# Patient Record
Sex: Female | Born: 2000 | Race: White | Hispanic: No | Marital: Single | Smoking: Never smoker
Health system: Southern US, Community
[De-identification: ages and names within clinical notes are randomized; demographics above are authoritative.]

## PROBLEM LIST (undated history)

## (undated) HISTORY — PX: WISDOM TOOTH EXTRACTION: SHX21

---

## 2019-08-24 DIAGNOSIS — N200 Calculus of kidney: Secondary | ICD-10-CM | POA: Insufficient documentation

## 2019-12-28 DIAGNOSIS — F988 Other specified behavioral and emotional disorders with onset usually occurring in childhood and adolescence: Secondary | ICD-10-CM | POA: Insufficient documentation

## 2020-03-23 DIAGNOSIS — K5909 Other constipation: Secondary | ICD-10-CM | POA: Insufficient documentation

## 2020-03-23 DIAGNOSIS — R5381 Other malaise: Secondary | ICD-10-CM | POA: Insufficient documentation

## 2020-10-04 DIAGNOSIS — E611 Iron deficiency: Secondary | ICD-10-CM | POA: Insufficient documentation

## 2020-11-17 DIAGNOSIS — E538 Deficiency of other specified B group vitamins: Secondary | ICD-10-CM | POA: Diagnosis not present

## 2020-11-28 DIAGNOSIS — Z87440 Personal history of urinary (tract) infections: Secondary | ICD-10-CM | POA: Diagnosis not present

## 2020-11-30 DIAGNOSIS — E538 Deficiency of other specified B group vitamins: Secondary | ICD-10-CM | POA: Diagnosis not present

## 2020-12-14 DIAGNOSIS — M546 Pain in thoracic spine: Secondary | ICD-10-CM | POA: Diagnosis not present

## 2020-12-14 DIAGNOSIS — E538 Deficiency of other specified B group vitamins: Secondary | ICD-10-CM | POA: Diagnosis not present

## 2020-12-14 DIAGNOSIS — G8929 Other chronic pain: Secondary | ICD-10-CM | POA: Diagnosis not present

## 2020-12-14 DIAGNOSIS — M545 Low back pain, unspecified: Secondary | ICD-10-CM | POA: Diagnosis not present

## 2020-12-19 DIAGNOSIS — R8271 Bacteriuria: Secondary | ICD-10-CM | POA: Diagnosis not present

## 2020-12-19 DIAGNOSIS — N390 Urinary tract infection, site not specified: Secondary | ICD-10-CM | POA: Diagnosis not present

## 2020-12-28 DIAGNOSIS — E538 Deficiency of other specified B group vitamins: Secondary | ICD-10-CM | POA: Diagnosis not present

## 2021-01-12 DIAGNOSIS — E538 Deficiency of other specified B group vitamins: Secondary | ICD-10-CM | POA: Diagnosis not present

## 2021-01-13 DIAGNOSIS — E538 Deficiency of other specified B group vitamins: Secondary | ICD-10-CM | POA: Diagnosis not present

## 2021-01-18 DIAGNOSIS — M6289 Other specified disorders of muscle: Secondary | ICD-10-CM | POA: Diagnosis not present

## 2021-01-18 DIAGNOSIS — R350 Frequency of micturition: Secondary | ICD-10-CM | POA: Diagnosis not present

## 2021-01-18 DIAGNOSIS — R102 Pelvic and perineal pain: Secondary | ICD-10-CM | POA: Diagnosis not present

## 2021-01-18 DIAGNOSIS — M62838 Other muscle spasm: Secondary | ICD-10-CM | POA: Diagnosis not present

## 2021-01-25 DIAGNOSIS — E538 Deficiency of other specified B group vitamins: Secondary | ICD-10-CM | POA: Diagnosis not present

## 2021-01-26 DIAGNOSIS — K5900 Constipation, unspecified: Secondary | ICD-10-CM | POA: Diagnosis not present

## 2021-01-26 DIAGNOSIS — M62838 Other muscle spasm: Secondary | ICD-10-CM | POA: Diagnosis not present

## 2021-01-26 DIAGNOSIS — M6289 Other specified disorders of muscle: Secondary | ICD-10-CM | POA: Diagnosis not present

## 2021-01-26 DIAGNOSIS — M6281 Muscle weakness (generalized): Secondary | ICD-10-CM | POA: Diagnosis not present

## 2021-02-08 DIAGNOSIS — E538 Deficiency of other specified B group vitamins: Secondary | ICD-10-CM | POA: Diagnosis not present

## 2021-02-09 DIAGNOSIS — M62838 Other muscle spasm: Secondary | ICD-10-CM | POA: Diagnosis not present

## 2021-02-09 DIAGNOSIS — K5900 Constipation, unspecified: Secondary | ICD-10-CM | POA: Diagnosis not present

## 2021-02-09 DIAGNOSIS — M6281 Muscle weakness (generalized): Secondary | ICD-10-CM | POA: Diagnosis not present

## 2021-02-09 DIAGNOSIS — M6289 Other specified disorders of muscle: Secondary | ICD-10-CM | POA: Diagnosis not present

## 2021-02-16 DIAGNOSIS — M62838 Other muscle spasm: Secondary | ICD-10-CM | POA: Diagnosis not present

## 2021-02-16 DIAGNOSIS — K5900 Constipation, unspecified: Secondary | ICD-10-CM | POA: Diagnosis not present

## 2021-02-16 DIAGNOSIS — M6281 Muscle weakness (generalized): Secondary | ICD-10-CM | POA: Diagnosis not present

## 2021-02-16 DIAGNOSIS — M6289 Other specified disorders of muscle: Secondary | ICD-10-CM | POA: Diagnosis not present

## 2021-02-22 DIAGNOSIS — E611 Iron deficiency: Secondary | ICD-10-CM | POA: Diagnosis not present

## 2021-02-22 DIAGNOSIS — R5383 Other fatigue: Secondary | ICD-10-CM | POA: Diagnosis not present

## 2021-02-22 DIAGNOSIS — R5381 Other malaise: Secondary | ICD-10-CM | POA: Diagnosis not present

## 2021-03-02 DIAGNOSIS — M6281 Muscle weakness (generalized): Secondary | ICD-10-CM | POA: Diagnosis not present

## 2021-03-02 DIAGNOSIS — M62838 Other muscle spasm: Secondary | ICD-10-CM | POA: Diagnosis not present

## 2021-03-02 DIAGNOSIS — K5900 Constipation, unspecified: Secondary | ICD-10-CM | POA: Diagnosis not present

## 2021-03-02 DIAGNOSIS — M6289 Other specified disorders of muscle: Secondary | ICD-10-CM | POA: Diagnosis not present

## 2021-03-05 DIAGNOSIS — R531 Weakness: Secondary | ICD-10-CM | POA: Diagnosis not present

## 2021-03-13 DIAGNOSIS — K5900 Constipation, unspecified: Secondary | ICD-10-CM | POA: Diagnosis not present

## 2021-03-13 DIAGNOSIS — R102 Pelvic and perineal pain: Secondary | ICD-10-CM | POA: Diagnosis not present

## 2021-03-13 DIAGNOSIS — M62838 Other muscle spasm: Secondary | ICD-10-CM | POA: Diagnosis not present

## 2021-03-13 DIAGNOSIS — M6289 Other specified disorders of muscle: Secondary | ICD-10-CM | POA: Diagnosis not present

## 2021-03-15 DIAGNOSIS — E538 Deficiency of other specified B group vitamins: Secondary | ICD-10-CM | POA: Diagnosis not present

## 2021-03-22 DIAGNOSIS — R5381 Other malaise: Secondary | ICD-10-CM | POA: Diagnosis not present

## 2021-03-22 DIAGNOSIS — R5383 Other fatigue: Secondary | ICD-10-CM | POA: Diagnosis not present

## 2021-03-22 DIAGNOSIS — M791 Myalgia, unspecified site: Secondary | ICD-10-CM | POA: Diagnosis not present

## 2021-03-22 DIAGNOSIS — E611 Iron deficiency: Secondary | ICD-10-CM | POA: Diagnosis not present

## 2021-03-22 DIAGNOSIS — F988 Other specified behavioral and emotional disorders with onset usually occurring in childhood and adolescence: Secondary | ICD-10-CM | POA: Diagnosis not present

## 2021-03-23 DIAGNOSIS — R102 Pelvic and perineal pain: Secondary | ICD-10-CM | POA: Diagnosis not present

## 2021-03-23 DIAGNOSIS — M6289 Other specified disorders of muscle: Secondary | ICD-10-CM | POA: Diagnosis not present

## 2021-03-23 DIAGNOSIS — M6281 Muscle weakness (generalized): Secondary | ICD-10-CM | POA: Diagnosis not present

## 2021-03-23 DIAGNOSIS — M62838 Other muscle spasm: Secondary | ICD-10-CM | POA: Diagnosis not present

## 2021-03-29 DIAGNOSIS — E538 Deficiency of other specified B group vitamins: Secondary | ICD-10-CM | POA: Diagnosis not present

## 2021-04-05 DIAGNOSIS — K5900 Constipation, unspecified: Secondary | ICD-10-CM | POA: Diagnosis not present

## 2021-04-05 DIAGNOSIS — M62838 Other muscle spasm: Secondary | ICD-10-CM | POA: Diagnosis not present

## 2021-04-05 DIAGNOSIS — M6281 Muscle weakness (generalized): Secondary | ICD-10-CM | POA: Diagnosis not present

## 2021-04-05 DIAGNOSIS — M6289 Other specified disorders of muscle: Secondary | ICD-10-CM | POA: Diagnosis not present

## 2021-04-12 DIAGNOSIS — E611 Iron deficiency: Secondary | ICD-10-CM | POA: Diagnosis not present

## 2021-07-25 DIAGNOSIS — R519 Headache, unspecified: Secondary | ICD-10-CM | POA: Insufficient documentation

## 2021-07-25 DIAGNOSIS — G8929 Other chronic pain: Secondary | ICD-10-CM | POA: Insufficient documentation

## 2021-07-26 DIAGNOSIS — R5383 Other fatigue: Secondary | ICD-10-CM | POA: Diagnosis not present

## 2021-07-26 DIAGNOSIS — R5381 Other malaise: Secondary | ICD-10-CM | POA: Diagnosis not present

## 2021-07-26 DIAGNOSIS — E611 Iron deficiency: Secondary | ICD-10-CM | POA: Diagnosis not present

## 2021-07-31 DIAGNOSIS — E538 Deficiency of other specified B group vitamins: Secondary | ICD-10-CM | POA: Insufficient documentation

## 2021-08-04 ENCOUNTER — Telehealth: Payer: Self-pay | Admitting: Oncology

## 2021-08-04 NOTE — Telephone Encounter (Signed)
Scheduled appt per 10/24 referral. Pt is aware of appt date and time.  

## 2021-08-08 ENCOUNTER — Telehealth: Payer: Self-pay | Admitting: Oncology

## 2021-08-08 NOTE — Telephone Encounter (Signed)
Patient rescheduled from 11/16 to 11/3 Labs 3:00 pm - Consult 3:30 pm.  Patient is experiencing dizziness

## 2021-08-09 ENCOUNTER — Other Ambulatory Visit: Payer: Self-pay | Admitting: Oncology

## 2021-08-09 DIAGNOSIS — D539 Nutritional anemia, unspecified: Secondary | ICD-10-CM

## 2021-08-09 NOTE — Progress Notes (Signed)
[facility]  [address] [place],  [place]  [address] [phone]  Clinic Day:  08/10/2021  Referring physician: Bess Harvest*   HISTORY OF PRESENT ILLNESS:  The patient is a 20 y.o. female  who I was asked to consult upon for having a history of iron deficiency anemia and B12 deficiency.  The patient has been taking an iron pill daily for the past year.  She claims her menstrual cycles are not particularly heavy.  She also denies other overt forms of blood loss.  With respect to her B12 deficiency, she takes two B12 injections every month.  To her knowledge, there is no family history of anemia or other hematologic disorders.  The patient has occasional heart palpitations; she and her family are not certain if they are related to her hematologic issues.  PAST MEDICAL HISTORY:  Iron deficiency anemia Vitamin B12 deficiency  PAST SURGICAL HISTORY:  Urethral surgery  CURRENT MEDICATIONS:   Current Outpatient Medications  Medication Sig Dispense Refill   cyanocobalamin (,VITAMIN B-12,) 1000 MCG/ML injection Inject into the muscle.     lisdexamfetamine (VYVANSE) 20 MG capsule Take 1 capsule by mouth every morning.     norethindrone-ethinyl estradiol-FE (LOESTRIN FE) 1-20 MG-MCG tablet Take 1 tablet by mouth daily.     Syringe/Needle, Disp, (SYRINGE 3CC/25GX1") 25G X 1" 3 ML MISC Use once a month with b12     ferrous sulfate 325 (65 FE) MG tablet Take by mouth.     Multiple Vitamin (MULTI-VITAMINS) TABS Take 1 tablet by mouth daily.     No current facility-administered medications for this visit.    ALLERGIES:   Allergies  Allergen Reactions   Penicillins Hives    FAMILY HISTORY:   Family History  Problem Relation Age of Onset   Thyroid disease Mother    Hyperlipidemia Mother    Hyperlipidemia Father    Hypertension Father     SOCIAL HISTORY:  The patient was born and raised in [place].  She lives in [place] with  her parents.  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  She denies tobacco use.  She does drink alcohol occasionally.  REVIEW OF SYSTEMS:  Review of Systems  Constitutional:  Positive for fatigue. Negative for fever.  HENT:   Negative for hearing loss and sore throat.   Eyes:  Negative for eye problems.  Respiratory:  Positive for shortness of breath (with exertion). Negative for chest tightness, cough and hemoptysis.   Cardiovascular:  Positive for palpitations. Negative for chest pain.  Gastrointestinal:  Positive for constipation. Negative for abdominal distention, abdominal pain, blood in stool, diarrhea, nausea and vomiting.  Endocrine: Negative for hot flashes.  Genitourinary:  Negative for difficulty urinating, dysuria, frequency, hematuria and nocturia.   Musculoskeletal:  Positive for myalgias. Negative for arthralgias, back pain and gait problem.  Skin: Negative.  Negative for itching and rash.  Neurological:  Positive for headaches. Negative for dizziness, extremity weakness, gait problem, light-headedness and numbness.  Hematological: Negative.   Psychiatric/Behavioral:  Negative for depression and suicidal ideas. The patient is nervous/anxious.     PHYSICAL EXAM:  Blood pressure 135/85, pulse (!) 113, temperature 98.5 F (36.9 C), resp. rate 16, height 5\' 7"  (1.702 m), weight 132 lb 12.8 oz (60.2 kg), SpO2 100 %. Wt Readings from Last 3 Encounters:  08/10/21 132 lb 12.8 oz (60.2 kg)   Body mass index is 20.8 kg/m. Performance status (ECOG): 0 - Asymptomatic Physical Exam Constitutional:  Appearance: Normal appearance. She is not ill-appearing.  HENT:     Mouth/Throat:     Mouth: Mucous membranes are moist.     Pharynx: Oropharynx is clear. No oropharyngeal exudate or posterior oropharyngeal erythema.  Cardiovascular:     Rate and Rhythm: Normal rate and regular rhythm.     Heart sounds: No murmur heard.   No friction rub. No gallop.  Pulmonary:     Effort: Pulmonary  effort is normal. No respiratory distress.     Breath sounds: Normal breath sounds. No wheezing, rhonchi or rales.  Abdominal:     General: Bowel sounds are normal. There is no distension.     Palpations: Abdomen is soft. There is no mass.     Tenderness: There is no abdominal tenderness.  Musculoskeletal:        General: No swelling.     Right lower leg: No edema.     Left lower leg: No edema.  Lymphadenopathy:     Cervical: No cervical adenopathy.     Upper Body:     Right upper body: No supraclavicular or axillary adenopathy.     Left upper body: No supraclavicular or axillary adenopathy.     Lower Body: No right inguinal adenopathy. No left inguinal adenopathy.  Skin:    General: Skin is warm.     Coloration: Skin is not jaundiced.     Findings: No lesion or rash.  Neurological:     General: No focal deficit present.     Mental Status: She is alert and oriented to person, place, and time. Mental status is at baseline.  Psychiatric:        Mood and Affect: Mood normal.        Behavior: Behavior normal.        Thought Content: Thought content normal.   LABS:   Recent labs at her primary care office included the following:    ASSESSMENT & PLAN:  A 20 y.o. female who I was asked to consult upon for anemia due to iron and B12 deficiencies.  When evaluating her CBC today, her hemoglobin is normal, as are all of her other hematologic parameters.  Recent labs collected at her primary care office showed no evidence of any persistent nutritional deficiencies.  From my standpoint, she can reduce her B12 shots to once monthly.  As her iron parameters are normal, I do not see the need for her to take iron every day.  I would not be opposed to her taking iron during those days when her menstrual cycle is active.  Overall, I do not sense any ominous hematologic problems being present.   I do feel comfortable turning her care back over to  her primary care office.  I would not have a problem  seeing this patient in the future if new hematologic issues arise that require repeat clinical assessment.    The patient understands all the plans discussed today and is in agreement with them.  I do appreciate Brown-Patram, Melissa J* for his new consult.   Rosilyn Coachman Kirby Funk, MD

## 2021-08-10 ENCOUNTER — Inpatient Hospital Stay: Payer: BC Managed Care – PPO | Attending: Oncology

## 2021-08-10 ENCOUNTER — Encounter: Payer: Self-pay | Admitting: Oncology

## 2021-08-10 ENCOUNTER — Telehealth: Payer: Self-pay | Admitting: Oncology

## 2021-08-10 ENCOUNTER — Other Ambulatory Visit: Payer: Self-pay

## 2021-08-10 ENCOUNTER — Inpatient Hospital Stay (INDEPENDENT_AMBULATORY_CARE_PROVIDER_SITE_OTHER): Payer: BC Managed Care – PPO | Admitting: Oncology

## 2021-08-10 VITALS — BP 135/85 | HR 113 | Temp 98.5°F | Resp 16 | Ht 67.0 in | Wt 132.8 lb

## 2021-08-10 DIAGNOSIS — E611 Iron deficiency: Secondary | ICD-10-CM

## 2021-08-10 DIAGNOSIS — E538 Deficiency of other specified B group vitamins: Secondary | ICD-10-CM | POA: Insufficient documentation

## 2021-08-10 DIAGNOSIS — D539 Nutritional anemia, unspecified: Secondary | ICD-10-CM

## 2021-08-10 DIAGNOSIS — D509 Iron deficiency anemia, unspecified: Secondary | ICD-10-CM | POA: Diagnosis not present

## 2021-08-10 LAB — CBC AND DIFFERENTIAL
HCT: 42 (ref 36–46)
Hemoglobin: 14.9 (ref 12.0–16.0)
Neutrophils Absolute: 4.31
Platelets: 197 (ref 150–399)
WBC: 7.3

## 2021-08-10 LAB — CBC: RBC: 4.97 (ref 3.87–5.11)

## 2021-08-10 LAB — FERRITIN: Ferritin: 54 ng/mL (ref 11–307)

## 2021-08-10 LAB — IRON AND TIBC
Iron: 99 ug/dL (ref 28–170)
Saturation Ratios: 22 % (ref 10.4–31.8)
TIBC: 459 ug/dL — ABNORMAL HIGH (ref 250–450)
UIBC: 360 ug/dL

## 2021-08-10 LAB — VITAMIN B12: Vitamin B-12: 420 pg/mL (ref 180–914)

## 2021-08-10 LAB — FOLATE: Folate: 28.3 ng/mL (ref >5.9)

## 2021-08-10 NOTE — Telephone Encounter (Signed)
No LOS Entered 

## 2021-08-23 ENCOUNTER — Other Ambulatory Visit: Payer: Self-pay

## 2021-08-23 ENCOUNTER — Encounter: Payer: Self-pay | Admitting: Oncology

## 2021-08-28 DIAGNOSIS — R079 Chest pain, unspecified: Secondary | ICD-10-CM | POA: Diagnosis not present

## 2021-08-28 DIAGNOSIS — R000 Tachycardia, unspecified: Secondary | ICD-10-CM | POA: Diagnosis not present

## 2021-08-28 DIAGNOSIS — R0789 Other chest pain: Secondary | ICD-10-CM | POA: Diagnosis not present

## 2021-08-28 DIAGNOSIS — R002 Palpitations: Secondary | ICD-10-CM | POA: Diagnosis not present

## 2021-09-05 DIAGNOSIS — R5381 Other malaise: Secondary | ICD-10-CM | POA: Diagnosis not present

## 2021-09-05 DIAGNOSIS — E611 Iron deficiency: Secondary | ICD-10-CM | POA: Diagnosis not present

## 2021-09-05 DIAGNOSIS — I4711 Inappropriate sinus tachycardia, so stated: Secondary | ICD-10-CM | POA: Insufficient documentation

## 2021-09-05 DIAGNOSIS — R000 Tachycardia, unspecified: Secondary | ICD-10-CM | POA: Diagnosis not present

## 2021-09-05 DIAGNOSIS — R002 Palpitations: Secondary | ICD-10-CM | POA: Diagnosis not present

## 2021-09-06 DIAGNOSIS — I498 Other specified cardiac arrhythmias: Secondary | ICD-10-CM | POA: Diagnosis not present

## 2021-09-20 DIAGNOSIS — R002 Palpitations: Secondary | ICD-10-CM | POA: Diagnosis not present

## 2021-10-05 DIAGNOSIS — M79641 Pain in right hand: Secondary | ICD-10-CM | POA: Diagnosis not present

## 2021-10-05 DIAGNOSIS — M79642 Pain in left hand: Secondary | ICD-10-CM | POA: Diagnosis not present

## 2021-10-11 DIAGNOSIS — R002 Palpitations: Secondary | ICD-10-CM | POA: Diagnosis not present

## 2021-10-13 DIAGNOSIS — I493 Ventricular premature depolarization: Secondary | ICD-10-CM | POA: Diagnosis not present

## 2021-10-21 DIAGNOSIS — N3091 Cystitis, unspecified with hematuria: Secondary | ICD-10-CM | POA: Diagnosis not present

## 2021-10-21 DIAGNOSIS — N3001 Acute cystitis with hematuria: Secondary | ICD-10-CM | POA: Diagnosis not present

## 2021-10-24 DIAGNOSIS — U099 Post covid-19 condition, unspecified: Secondary | ICD-10-CM | POA: Diagnosis not present

## 2021-10-24 DIAGNOSIS — E538 Deficiency of other specified B group vitamins: Secondary | ICD-10-CM | POA: Diagnosis not present

## 2021-10-24 DIAGNOSIS — R5381 Other malaise: Secondary | ICD-10-CM | POA: Diagnosis not present

## 2021-10-24 DIAGNOSIS — E611 Iron deficiency: Secondary | ICD-10-CM | POA: Diagnosis not present

## 2021-10-24 DIAGNOSIS — M791 Myalgia, unspecified site: Secondary | ICD-10-CM | POA: Diagnosis not present

## 2021-10-25 ENCOUNTER — Ambulatory Visit
Admission: RE | Admit: 2021-10-25 | Discharge: 2021-10-25 | Disposition: A | Discharge status: Home / Self Care | Payer: BC Managed Care – PPO | Source: Ambulatory Visit | Attending: Specialist | Admitting: Specialist

## 2021-10-25 ENCOUNTER — Other Ambulatory Visit: Payer: Self-pay

## 2021-10-25 ENCOUNTER — Other Ambulatory Visit: Payer: Self-pay | Admitting: Specialist

## 2021-10-25 DIAGNOSIS — J982 Interstitial emphysema: Secondary | ICD-10-CM | POA: Diagnosis not present

## 2021-10-25 DIAGNOSIS — R0902 Hypoxemia: Secondary | ICD-10-CM | POA: Diagnosis not present

## 2021-10-25 DIAGNOSIS — R7981 Abnormal blood-gas level: Secondary | ICD-10-CM

## 2021-10-25 DIAGNOSIS — R918 Other nonspecific abnormal finding of lung field: Secondary | ICD-10-CM | POA: Diagnosis not present

## 2021-10-25 MED ORDER — IOPAMIDOL (ISOVUE-370) INJECTION 76%
75.0000 mL | Freq: Once | INTRAVENOUS | Status: AC | PRN
  Administered 2021-10-25: 75 mL via INTRAVENOUS

## 2021-10-26 DIAGNOSIS — R0602 Shortness of breath: Secondary | ICD-10-CM | POA: Diagnosis not present

## 2021-10-31 DIAGNOSIS — U099 Post covid-19 condition, unspecified: Secondary | ICD-10-CM | POA: Diagnosis not present

## 2021-10-31 DIAGNOSIS — R7981 Abnormal blood-gas level: Secondary | ICD-10-CM | POA: Diagnosis not present

## 2021-10-31 DIAGNOSIS — R0602 Shortness of breath: Secondary | ICD-10-CM | POA: Diagnosis not present

## 2021-10-31 DIAGNOSIS — R059 Cough, unspecified: Secondary | ICD-10-CM | POA: Diagnosis not present

## 2021-11-07 DIAGNOSIS — J982 Interstitial emphysema: Secondary | ICD-10-CM | POA: Diagnosis not present

## 2021-11-07 DIAGNOSIS — R000 Tachycardia, unspecified: Secondary | ICD-10-CM | POA: Diagnosis not present

## 2021-11-07 DIAGNOSIS — E611 Iron deficiency: Secondary | ICD-10-CM | POA: Diagnosis not present

## 2021-11-07 DIAGNOSIS — R0602 Shortness of breath: Secondary | ICD-10-CM | POA: Diagnosis not present

## 2021-11-10 DIAGNOSIS — J0100 Acute maxillary sinusitis, unspecified: Secondary | ICD-10-CM | POA: Diagnosis not present

## 2021-11-10 DIAGNOSIS — R519 Headache, unspecified: Secondary | ICD-10-CM | POA: Diagnosis not present

## 2021-11-10 DIAGNOSIS — Z20828 Contact with and (suspected) exposure to other viral communicable diseases: Secondary | ICD-10-CM | POA: Diagnosis not present

## 2021-11-10 DIAGNOSIS — R0981 Nasal congestion: Secondary | ICD-10-CM | POA: Diagnosis not present

## 2021-11-15 DIAGNOSIS — Z87891 Personal history of nicotine dependence: Secondary | ICD-10-CM | POA: Diagnosis not present

## 2021-11-15 DIAGNOSIS — R0602 Shortness of breath: Secondary | ICD-10-CM | POA: Diagnosis not present

## 2021-11-15 LAB — PULMONARY FUNCTION TEST

## 2021-11-23 DIAGNOSIS — R7981 Abnormal blood-gas level: Secondary | ICD-10-CM | POA: Diagnosis not present

## 2021-11-23 DIAGNOSIS — R0602 Shortness of breath: Secondary | ICD-10-CM | POA: Diagnosis not present

## 2021-11-23 DIAGNOSIS — J982 Interstitial emphysema: Secondary | ICD-10-CM | POA: Diagnosis not present

## 2021-11-23 DIAGNOSIS — Z8616 Personal history of COVID-19: Secondary | ICD-10-CM | POA: Diagnosis not present

## 2021-12-25 DIAGNOSIS — Z20828 Contact with and (suspected) exposure to other viral communicable diseases: Secondary | ICD-10-CM | POA: Diagnosis not present

## 2021-12-25 DIAGNOSIS — R519 Headache, unspecified: Secondary | ICD-10-CM | POA: Diagnosis not present

## 2021-12-25 DIAGNOSIS — J111 Influenza due to unidentified influenza virus with other respiratory manifestations: Secondary | ICD-10-CM | POA: Diagnosis not present

## 2021-12-25 DIAGNOSIS — R0981 Nasal congestion: Secondary | ICD-10-CM | POA: Diagnosis not present

## 2021-12-25 DIAGNOSIS — R051 Acute cough: Secondary | ICD-10-CM | POA: Diagnosis not present

## 2022-01-03 DIAGNOSIS — K5909 Other constipation: Secondary | ICD-10-CM | POA: Diagnosis not present

## 2022-01-03 DIAGNOSIS — J982 Interstitial emphysema: Secondary | ICD-10-CM | POA: Diagnosis not present

## 2022-01-03 DIAGNOSIS — R000 Tachycardia, unspecified: Secondary | ICD-10-CM | POA: Diagnosis not present

## 2022-01-03 DIAGNOSIS — E538 Deficiency of other specified B group vitamins: Secondary | ICD-10-CM | POA: Diagnosis not present

## 2022-01-03 DIAGNOSIS — N200 Calculus of kidney: Secondary | ICD-10-CM | POA: Diagnosis not present

## 2022-02-13 DIAGNOSIS — J982 Interstitial emphysema: Secondary | ICD-10-CM | POA: Diagnosis not present

## 2022-02-20 DIAGNOSIS — Z8616 Personal history of COVID-19: Secondary | ICD-10-CM | POA: Diagnosis not present

## 2022-02-20 DIAGNOSIS — R0602 Shortness of breath: Secondary | ICD-10-CM | POA: Diagnosis not present

## 2022-02-20 DIAGNOSIS — J982 Interstitial emphysema: Secondary | ICD-10-CM | POA: Diagnosis not present

## 2022-03-22 DIAGNOSIS — U099 Post covid-19 condition, unspecified: Secondary | ICD-10-CM | POA: Diagnosis not present

## 2022-03-22 DIAGNOSIS — R5381 Other malaise: Secondary | ICD-10-CM | POA: Diagnosis not present

## 2022-03-22 DIAGNOSIS — R5383 Other fatigue: Secondary | ICD-10-CM | POA: Diagnosis not present

## 2022-03-22 DIAGNOSIS — R7981 Abnormal blood-gas level: Secondary | ICD-10-CM | POA: Diagnosis not present

## 2022-03-22 DIAGNOSIS — R000 Tachycardia, unspecified: Secondary | ICD-10-CM | POA: Diagnosis not present

## 2022-04-02 ENCOUNTER — Encounter: Payer: Self-pay | Admitting: Pulmonary Disease

## 2022-04-02 ENCOUNTER — Ambulatory Visit: Payer: BC Managed Care – PPO | Admitting: Pulmonary Disease

## 2022-04-02 VITALS — BP 116/68 | HR 86 | Temp 97.9°F | Ht 67.0 in | Wt 124.0 lb

## 2022-04-02 DIAGNOSIS — R0609 Other forms of dyspnea: Secondary | ICD-10-CM | POA: Diagnosis not present

## 2022-04-02 MED ORDER — TRELEGY ELLIPTA 200-62.5-25 MCG/ACT IN AEPB: 1.0000 | INHALATION_SPRAY | Freq: Every day | RESPIRATORY_TRACT | 0 refills | Status: DC

## 2022-04-04 ENCOUNTER — Institutional Professional Consult (permissible substitution): Payer: BC Managed Care – PPO | Admitting: Pulmonary Disease

## 2022-04-04 NOTE — Progress Notes (Signed)
_0  ID: Lisa Lawrence, female    DOB: 2001-04-06, 21 y.o.   MRN: [id number]  Chief Complaint  Patient presents with   Consult    Pt here for consult for SOB. Pt states SOB started when she had covid. Pt states she was positive for covid in 2020. Pt states she has SOB at the time. Pt is on Albuterol prn and Flovent daily     Referring provider: Bess Harvest*  HPI:   21 y.o. woman whom are seen in consultation evaluation of dyspnea on exertion.  Most recent pulmonary note x2 from [facility] reviewed.  Most recent cardiology note from [facility] reviewed.  Patient contracted COVID about 2 years ago.  Prior to that no issues.  Avid an active exerciser.  Very athletic playing sports in high school.  Since then has had marked dyspnea.  Associate with chest tightness.  Present on flat surfaces with short distances.  Worse on inclines or stairs.  No time of day that seems to make things better or worse.  No position makes it better or worse.  She does note triggers such as candle smoke, fire smoke, other scents that sometimes trigger and make the symptoms come on.  She has been using Flovent 1 puff twice a day with mild improvement in symptoms.  Uses albuterol intermittently sometimes helps.  Sometimes does not.  Does not help immediately.  She also reports tachycardia.  Heart rates up to 160s just walking around.  Out her portion to level of exertion.  Being seen by cardiology for this.  Recently prescribed beta-blocker.  Chest imaging includes CT scan 10/2021 personally reviewed and interpreted a small amount of pneumomediastinum, otherwise clear lungs no evidence of emphysema or abnormality in the parenchyma.  Repeat CT scan 02/2022 report in care everywhere of clear lungs and resolution of pneumomediastinum.  She had PFTs 11/2021, reviewed without evidence obstruction, DLCO within normal limits.  Cannot see lung volumes.  Labs in the past without elevation in eosinophils.  PMH:  No significant illness Surgical history: Wisdom tooth extraction Family history: Mother with hyperlipidemia, thyroid disease, father with hyperlipidemia, hypertension Social history: Never smoker, works at [employer] / Pulmonary Flowsheets:   ACT:      No data to display          MMRC:     No data to display          Epworth:      No data to display          Tests:   FENO:  No results found for: "NITRICOXIDE"  PFT:     No data to display          WALK:      No data to display          Imaging: Personally reviewed and as per EMR and discussion in this note No results found.  Lab Results: Personally reviewed, no elevation of eosinophils CBC    Component Value Date/Time   WBC 7.3 08/10/2021 0000   RBC 4.97 08/10/2021 0000   HGB 14.9 08/10/2021 0000   HCT 42 08/10/2021 0000   PLT 197 08/10/2021 0000    BMET No results found for: "NA", "K", "CL", "CO2", "GLUCOSE", "BUN", "CREATININE", "CALCIUM", "GFRNONAA", "GFRAA"  BNP No results found for: "BNP"  ProBNP No results found for: "PROBNP"  Specialty Problems   None   Allergies  Allergen Reactions   Amoxicillin Hives   Penicillins Hives  Immunization History  Administered Date(s) Administered   DTaP 05/21/2000, 07/22/2000, 09/23/2000, 11/17/2001, 03/27/2004   HPV Quadrivalent 06/08/2011, 08/10/2011, 02/11/2012   Hepatitis B, ped/adol 07-31-2000, 04/16/2000, 09/23/2000   HiB (PRP-T) 05/21/2000, 07/22/2000, 09/23/2000, 06/30/2001   IPV 05/21/2000, 07/22/2000, 11/17/2001, 03/27/2004   Influenza,inj,Quad PF,6+ Mos 08/30/2017, 09/02/2018   MMR 03/26/2001, 03/27/2004   Meningococcal Conjugate 06/16/2012, 08/30/2017   Pneumococcal Conjugate-13 05/21/2000, 07/22/2000, 09/23/2000, 11/17/2001   Tdap 05/29/2010   Varicella 03/26/2001, 03/15/2008    History reviewed. No pertinent past medical history.  Tobacco History: Social History   Tobacco Use  Smoking  Status Never  Smokeless Tobacco Never   Counseling given: Not Answered   Continue to not smoke  Outpatient Encounter Medications as of 04/02/2022  Medication Sig   albuterol (VENTOLIN HFA) 108 (90 Base) MCG/ACT inhaler Inhale into the lungs.   cyanocobalamin (,VITAMIN B-12,) 1000 MCG/ML injection Inject into the muscle.   ferrous sulfate 325 (65 FE) MG tablet Take by mouth.   Fluticasone-Umeclidin-Vilant (TRELEGY ELLIPTA) 200-62.5-25 MCG/ACT AEPB Inhale 1 puff into the lungs daily.   lisdexamfetamine (VYVANSE) 20 MG capsule Take 1 capsule by mouth every morning.   Multiple Vitamin (MULTI-VITAMINS) TABS Take 1 tablet by mouth daily.   norethindrone-ethinyl estradiol-FE (LOESTRIN FE) 1-20 MG-MCG tablet Take 1 tablet by mouth daily.   Syringe/Needle, Disp, (SYRINGE 3CC/25GX1") 25G X 1" 3 ML MISC Use once a month with b12   [DISCONTINUED] FLOVENT HFA 110 MCG/ACT inhaler Inhale 1 puff into the lungs 2 (two) times daily.   No facility-administered encounter medications on file as of 04/02/2022.     Review of Systems  Review of Systems  No chest pain with exertion.  No orthopnea or PND.  Comprehensive review of systems otherwise negative. Physical Exam  BP 116/68 (BP Location: Left Arm, Patient Position: Sitting, Cuff Size: Normal)   Pulse 86   Temp 97.9 F (36.6 C) (Oral)   Ht 5' 7" (1.702 m)   Wt 124 lb (56.2 kg)   SpO2 98%   BMI 19.42 kg/m   Wt Readings from Last 5 Encounters:  04/02/22 124 lb (56.2 kg)  08/10/21 132 lb 12.8 oz (60.2 kg)    BMI Readings from Last 5 Encounters:  04/02/22 19.42 kg/m  08/10/21 20.80 kg/m     Physical Exam General: Well-appearing, no acute distress Eyes: EOMI, icterus Neck: Supple, no JVP Pulmonary: Clear, normal work of breathing Cardiovascular: Regular rate and rhythm, no murmur Abdomen: Nondistended, sounds present MSK: Vascular joint fusion Neuro: Normal gait, no weakness Psych: Normal mood, full affect   Assessment & Plan:    Dyspnea on exertion, chest tightness: Following COVID infection.  High suspicion for development of asthma.  See below for further discussion.  She reports significant tachycardia seems out of portion to the degree of exertion noted.  Possible cardiac contributor.  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  Chest imaging clear.  PFTs 2/23 via [facility] with normal FEV1: FVC ratio, DLCO 100% predicted..  Asthma: Clear triggers.  Mild atopic symptoms.  Worsened after COVID infection.  Mild improvement with inhalers although not much.  Escalate low-dose Flovent to high-dose Trelegy 1 puff daily.  [facility]  Return in about 8 weeks (around 05/28/2022).   MLanier Clam MD 04/04/2022

## 2022-04-23 ENCOUNTER — Telehealth: Payer: Self-pay | Admitting: Pulmonary Disease

## 2022-04-23 MED ORDER — TRELEGY ELLIPTA 200-62.5-25 MCG/ACT IN AEPB: 1.0000 | INHALATION_SPRAY | Freq: Every day | RESPIRATORY_TRACT | 3 refills | Status: DC

## 2022-04-23 NOTE — Telephone Encounter (Signed)
Called patient and verified medication that she wanted filled. Patient states sample worked great for her. Verified pharmacy as well. Nothing further needed

## 2022-04-23 NOTE — Telephone Encounter (Signed)
Patient would like 3 month prescription of Trelegy called into [facility] mail in service.  Please advise- call back number is [phone]

## 2022-04-25 ENCOUNTER — Encounter: Payer: Self-pay | Admitting: Pulmonary Disease

## 2022-04-25 DIAGNOSIS — J449 Chronic obstructive pulmonary disease, unspecified: Secondary | ICD-10-CM

## 2022-04-25 NOTE — Telephone Encounter (Signed)
Dr. Hunsucker, please see mychart message sent by pt and advise. 

## 2022-04-27 MED ORDER — BREZTRI AEROSPHERE 160-9-4.8 MCG/ACT IN AERO: 2.0000 | INHALATION_SPRAY | Freq: Two times a day (BID) | RESPIRATORY_TRACT | 0 refills | Status: DC

## 2022-05-10 DIAGNOSIS — I498 Other specified cardiac arrhythmias: Secondary | ICD-10-CM | POA: Diagnosis not present

## 2022-05-23 ENCOUNTER — Other Ambulatory Visit: Payer: Self-pay | Admitting: Pulmonary Disease

## 2022-05-23 DIAGNOSIS — J449 Chronic obstructive pulmonary disease, unspecified: Secondary | ICD-10-CM

## 2022-05-23 MED ORDER — BREZTRI AEROSPHERE 160-9-4.8 MCG/ACT IN AERO: 2.0000 | INHALATION_SPRAY | Freq: Two times a day (BID) | RESPIRATORY_TRACT | 0 refills | Status: DC

## 2022-05-30 ENCOUNTER — Ambulatory Visit: Payer: BC Managed Care – PPO | Admitting: Pulmonary Disease

## 2022-05-30 ENCOUNTER — Encounter: Payer: Self-pay | Admitting: Pulmonary Disease

## 2022-05-30 DIAGNOSIS — L509 Urticaria, unspecified: Secondary | ICD-10-CM | POA: Diagnosis not present

## 2022-05-30 DIAGNOSIS — J449 Chronic obstructive pulmonary disease, unspecified: Secondary | ICD-10-CM

## 2022-05-30 MED ORDER — BREZTRI AEROSPHERE 160-9-4.8 MCG/ACT IN AERO: 2.0000 | INHALATION_SPRAY | Freq: Two times a day (BID) | RESPIRATORY_TRACT | 6 refills | Status: DC

## 2022-05-30 NOTE — Patient Instructions (Signed)
Nice to see you  Continue Breztri 2 puff in the morning and 2 puff in evening. Rinse mouth after every use.   Let me know results of tilt table test  We may need to pursue a cardiopulmonary exercise test if the tilt table is non-diagnostic.  Return to clinic in 3 months or sooner as needed with Dr. Judeth Horn

## 2022-05-31 NOTE — Progress Notes (Signed)
_0  ID: Sheralyn Boatman, female    DOB: Oct 29, 2000, 21 y.o.   MRN: [id number]  Chief Complaint  Patient presents with   Follow-up    Follow up for DOE. Pt states that she is doing well since switching to breztri. No effects noted with medication thus far.     Referring provider: Bess Harvest*  HPI:   21 y.o. woman whom are seen in follow up evaluation of dyspnea on exertion.  Most recent Cardiology note from [facility] reviewed.  At last visit, Flovent inhaler was escalated to triple inhaled therapy via Trelegy given severity of symptoms.  She had bronchodilator response on PFTs performed at [facility] earlier in the year, 2023 on my review and interpretation.  She states this is helped some.  She still feels pretty dyspneic compared to where she was a few years ago.  Maybe not a major improvement with some mild improvement with the Trelegy.  She was started on a beta-blocker by her cardiologist given inappropriate tachycardia.  She describes more fast heart rate in terms of exertional limitation.  Although this certainly translate to the sensation of dyspnea.  She has been able to exercise some.  She states the next day she is quite rundown.   HPI at initial visit: Patient contracted COVID about 2 years ago.  Prior to that no issues.  Avid an active exerciser.  Very athletic playing sports in high school.  Since then has had marked dyspnea.  Associate with chest tightness.  Present on flat surfaces with short distances.  Worse on inclines or stairs.  No time of day that seems to make things better or worse.  No position makes it better or worse.  She does note triggers such as candle smoke, fire smoke, other scents that sometimes trigger and make the symptoms come on.  She has been using Flovent 1 puff twice a day with mild improvement in symptoms.  Uses albuterol intermittently sometimes helps.  Sometimes does not.  Does not help immediately.  She also reports tachycardia.   Heart rates up to 160s just walking around.  Out her portion to level of exertion.  Being seen by cardiology for this.  Recently prescribed beta-blocker.  Chest imaging includes CT scan 10/2021 personally reviewed and interpreted a small amount of pneumomediastinum, otherwise clear lungs no evidence of emphysema or abnormality in the parenchyma.  Repeat CT scan 02/2022 report in care everywhere of clear lungs and resolution of pneumomediastinum.  She had PFTs 11/2021, reviewed without evidence obstruction, DLCO within normal limits.  Cannot see lung volumes.  Labs in the past without elevation in eosinophils.  PMH: No significant illness Surgical history: Wisdom tooth extraction Family history: Mother with hyperlipidemia, thyroid disease, father with hyperlipidemia, hypertension Social history: Never smoker, works at [employer] / Pulmonary Flowsheets:   ACT:      No data to display           MMRC:     No data to display           Epworth:      No data to display           Tests:   FENO:  No results found for: "NITRICOXIDE"  PFT:     No data to display           WALK:      No data to display           Imaging: Personally  reviewed and as per EMR and discussion in this note No results found.  Lab Results: Personally reviewed, no elevation of eosinophils CBC    Component Value Date/Time   WBC 7.3 08/10/2021 0000   RBC 4.97 08/10/2021 0000   HGB 14.9 08/10/2021 0000   HCT 42 08/10/2021 0000   PLT 197 08/10/2021 0000    BMET No results found for: "NA", "K", "CL", "CO2", "GLUCOSE", "BUN", "CREATININE", "CALCIUM", "GFRNONAA", "GFRAA"  BNP No results found for: "BNP"  ProBNP No results found for: "PROBNP"  Specialty Problems   None   Allergies  Allergen Reactions   Amoxicillin Hives   Penicillins Hives    Immunization History  Administered Date(s) Administered   DTaP 05/21/2000, 07/22/2000, 09/23/2000,  11/17/2001, 03/27/2004   HPV Quadrivalent 06/08/2011, 08/10/2011, 02/11/2012   Hepatitis B, ped/adol [birth year] 1999, 04/16/2000, 09/23/2000   HiB (PRP-T) 05/21/2000, 07/22/2000, 09/23/2000, 06/30/2001   IPV 05/21/2000, 07/22/2000, 11/17/2001, 03/27/2004   Influenza,inj,Quad PF,6+ Mos 08/30/2017, 09/02/2018   MMR 03/26/2001, 03/27/2004   Meningococcal Conjugate 06/16/2012, 08/30/2017   Pneumococcal Conjugate-13 05/21/2000, 07/22/2000, 09/23/2000, 11/17/2001   Tdap 05/29/2010   Varicella 03/26/2001, 03/15/2008    History reviewed. No pertinent past medical history.  Tobacco History: Social History   Tobacco Use  Smoking Status Never  Smokeless Tobacco Never   Counseling given: Not Answered   Continue to not smoke  Outpatient Encounter Medications as of 05/30/2022  Medication Sig   albuterol (VENTOLIN HFA) 108 (90 Base) MCG/ACT inhaler Inhale into the lungs.   ferrous sulfate 325 (65 FE) MG tablet Take by mouth.   lisdexamfetamine (VYVANSE) 20 MG capsule Take 1 capsule by mouth every morning.   Multiple Vitamin (MULTI-VITAMINS) TABS Take 1 tablet by mouth daily.   norethindrone-ethinyl estradiol-FE (LOESTRIN FE) 1-20 MG-MCG tablet Take 1 tablet by mouth daily.   Syringe/Needle, Disp, (SYRINGE 3CC/25GX1") 25G X 1" 3 ML MISC Use once a month with b12   [DISCONTINUED] Budeson-Glycopyrrol-Formoterol (BREZTRI AEROSPHERE) 160-9-4.8 MCG/ACT AERO Inhale 2 puffs into the lungs in the morning and at bedtime.   Budeson-Glycopyrrol-Formoterol (BREZTRI AEROSPHERE) 160-9-4.8 MCG/ACT AERO Inhale 2 puffs into the lungs in the morning and at bedtime.   No facility-administered encounter medications on file as of 05/30/2022.     Review of Systems  Review of Systems  N/a Physical Exam  BP 120/62 (BP Location: Left Arm, Patient Position: Sitting, Cuff Size: Normal)   Pulse 96   Temp 98.2 F (36.8 C) (Oral)   Ht _0  (1.702 m)   Wt 129 lb (58.5 kg)   SpO2 99%   BMI 20.20 kg/m   Wt  Readings from Last 5 Encounters:  05/30/22 129 lb (58.5 kg)  04/02/22 124 lb (56.2 kg)  08/10/21 132 lb 12.8 oz (60.2 kg)    BMI Readings from Last 5 Encounters:  05/30/22 20.20 kg/m  04/02/22 19.42 kg/m  08/10/21 20.80 kg/m     Physical Exam General: Well-appearing, no acute distress Eyes: EOMI, icterus Neck: Supple, no JVP Pulmonary: Clear, normal work of breathing Cardiovascular: Regular rate and rhythm, no murmur Abdomen: Nondistended, sounds present MSK: Vascular joint fusion Neuro: Normal gait, no weakness Psych: Normal mood, full affect   Assessment & Plan:   Dyspnea on exertion, chest tightness: Following COVID infection.  High suspicion for development of asthma.  Chest imaging clear.  PFTs 2/23 via [facility] network with normal FEV1: FVC ratio, DLCO 100% predicted, but with significant bronchodilator response.  Her symptoms certainly seem out of proportion to this alone.  Do suspect some underlying tachycardic or inappropriate high heart rate limiting her.  She has upcoming tilt table test for POTS.  This could be contributing.  If this is nondiagnostic, suspect would move forward with cardiopulmonary exercise test for further evaluation.  Could consider addition of Biologics if shows ventilatory defect.  Asthma: Clear triggers.  Mild atopic symptoms.  Worsened after COVID infection.  Mild improvement with inhalers although not much.  Bronchodilator spots on PFTs.  Trelegy caused throat hoarseness, soreness.  Breztri was prescribed with resolution of the side effects.  Mild improvement in symptoms with inhaler therapy.  Refilled today.  Continue Breztri 2 puff twice daily and albuterol as needed.  She has not used albuterol very much.  Return in about 3 months (around 08/30/2022).   Lanier Clam, MD 05/31/2022

## 2022-06-06 DIAGNOSIS — E538 Deficiency of other specified B group vitamins: Secondary | ICD-10-CM | POA: Diagnosis not present

## 2022-06-06 DIAGNOSIS — R000 Tachycardia, unspecified: Secondary | ICD-10-CM | POA: Diagnosis not present

## 2022-06-06 DIAGNOSIS — I479 Paroxysmal tachycardia, unspecified: Secondary | ICD-10-CM | POA: Diagnosis not present

## 2022-06-06 DIAGNOSIS — F909 Attention-deficit hyperactivity disorder, unspecified type: Secondary | ICD-10-CM | POA: Diagnosis not present

## 2022-06-06 DIAGNOSIS — Z8616 Personal history of COVID-19: Secondary | ICD-10-CM | POA: Diagnosis not present

## 2022-06-06 DIAGNOSIS — D509 Iron deficiency anemia, unspecified: Secondary | ICD-10-CM | POA: Diagnosis not present

## 2022-06-17 DIAGNOSIS — J309 Allergic rhinitis, unspecified: Secondary | ICD-10-CM | POA: Diagnosis not present

## 2022-06-30 ENCOUNTER — Encounter: Payer: Self-pay | Admitting: Pulmonary Disease

## 2022-07-02 MED ORDER — ALBUTEROL SULFATE HFA 108 (90 BASE) MCG/ACT IN AERS: 1.0000 | INHALATION_SPRAY | Freq: Four times a day (QID) | RESPIRATORY_TRACT | 5 refills | Status: DC | PRN

## 2022-07-18 ENCOUNTER — Encounter: Payer: Self-pay | Admitting: Pulmonary Disease

## 2022-07-19 ENCOUNTER — Other Ambulatory Visit: Payer: Self-pay

## 2022-07-19 DIAGNOSIS — J449 Chronic obstructive pulmonary disease, unspecified: Secondary | ICD-10-CM

## 2022-07-19 MED ORDER — BREZTRI AEROSPHERE 160-9-4.8 MCG/ACT IN AERO: 2.0000 | INHALATION_SPRAY | Freq: Two times a day (BID) | RESPIRATORY_TRACT | 6 refills | Status: DC

## 2022-07-26 DIAGNOSIS — N3090 Cystitis, unspecified without hematuria: Secondary | ICD-10-CM | POA: Diagnosis not present

## 2022-07-26 DIAGNOSIS — R300 Dysuria: Secondary | ICD-10-CM | POA: Diagnosis not present

## 2022-08-17 ENCOUNTER — Encounter: Payer: Self-pay | Admitting: Pulmonary Disease

## 2022-08-20 NOTE — Telephone Encounter (Signed)
Hello Lisa Lawrence,   It looks like a refill was sent to [facility] on 07/19/22 for 6 refills. Are you needing the refills to be sent to a different pharmacy?

## 2022-08-21 ENCOUNTER — Encounter: Payer: Self-pay | Admitting: Pulmonary Disease

## 2022-08-21 NOTE — Telephone Encounter (Signed)
Handled in a different encounter

## 2022-08-25 DIAGNOSIS — J029 Acute pharyngitis, unspecified: Secondary | ICD-10-CM | POA: Diagnosis not present

## 2022-08-25 DIAGNOSIS — J019 Acute sinusitis, unspecified: Secondary | ICD-10-CM | POA: Diagnosis not present

## 2022-08-25 DIAGNOSIS — J069 Acute upper respiratory infection, unspecified: Secondary | ICD-10-CM | POA: Diagnosis not present

## 2022-10-15 ENCOUNTER — Encounter: Payer: Self-pay | Admitting: Pulmonary Disease

## 2022-10-17 ENCOUNTER — Ambulatory Visit: Payer: BC Managed Care – PPO | Admitting: Pulmonary Disease

## 2022-10-21 DIAGNOSIS — J019 Acute sinusitis, unspecified: Secondary | ICD-10-CM | POA: Diagnosis not present

## 2022-10-27 IMAGING — CT CT ANGIO CHEST
1 of 2 series · 19 of 32 positions shown · IV contrast (APPLIED)
Comparison: 08/28/2021

CLINICAL DATA: Hypoxia, chest tightness

EXAM:
CT ANGIOGRAPHY CHEST WITH CONTRAST
TECHNIQUE: Multidetector CT imaging of the chest was performed using the
standard protocol during bolus administration of intravenous
contrast. Multiplanar CT image reconstructions and MIPs were
obtained to evaluate the vascular anatomy.

[Series 10: thins 1.0 b31s · axial · 0.71mm/px · z∈[-155,+178]mm · 19 of 371 slices shown]
[im 19/371  lung]
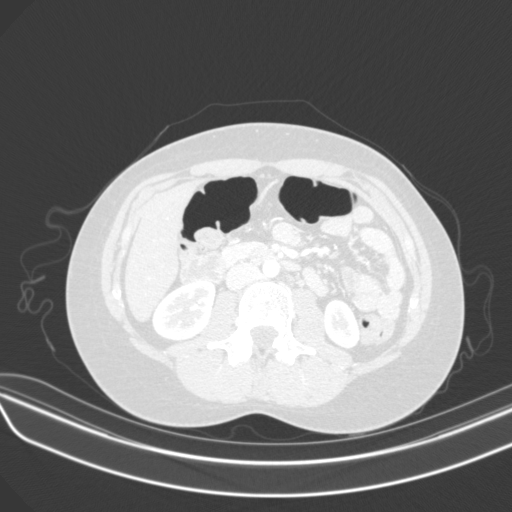
[im 38/371  mediastinal]
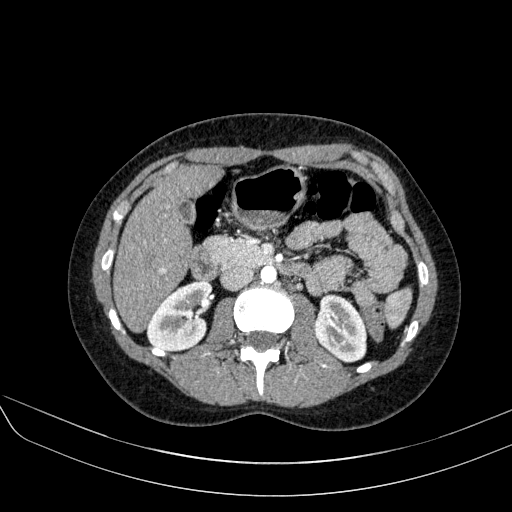
[im 56/371  lung]
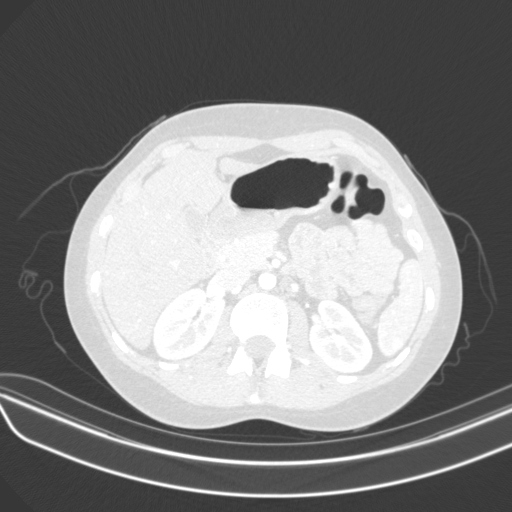
[im 93/371  mediastinal]
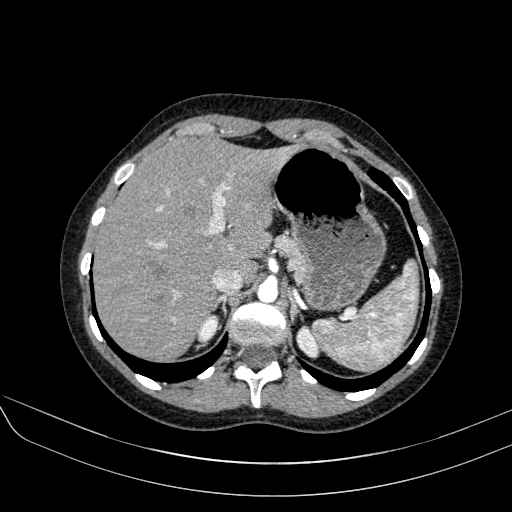
[im 112/371  lung]
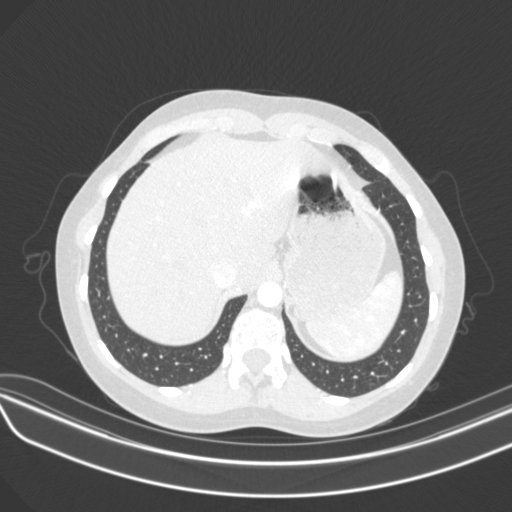
[im 124/371  mediastinal]
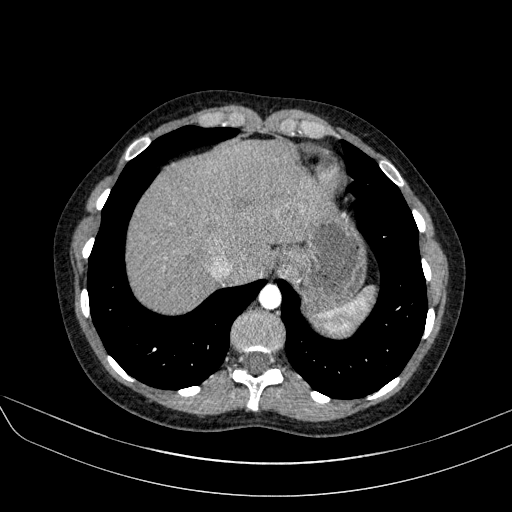
[im 130/371  lung]
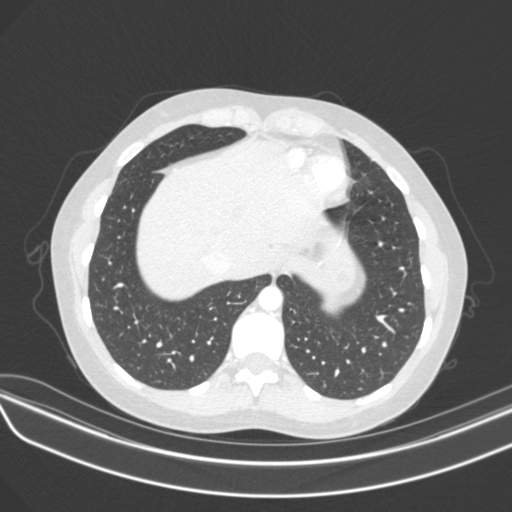
[im 149/371  mediastinal]
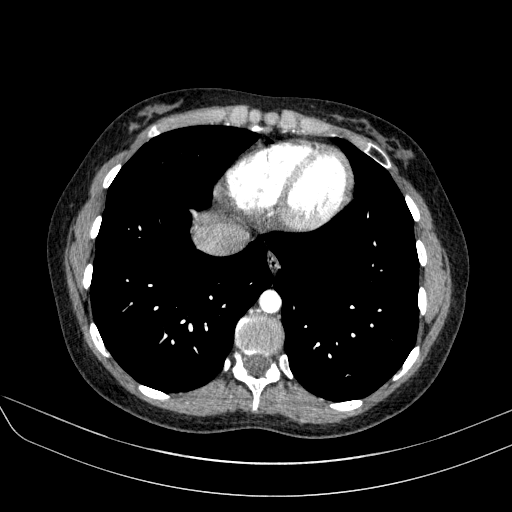
[im 167/371  lung]
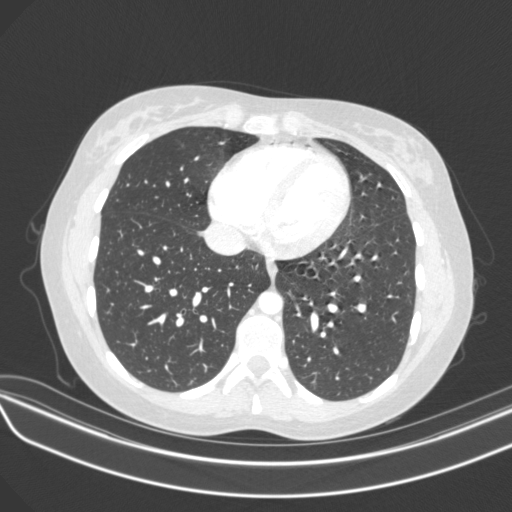
[im 186/371  mediastinal]
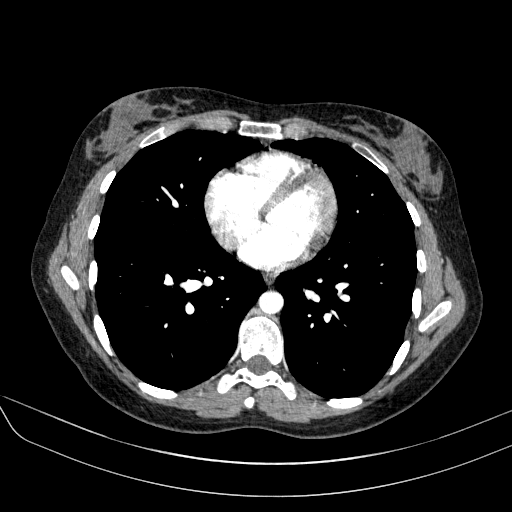
[im 204/371  lung]
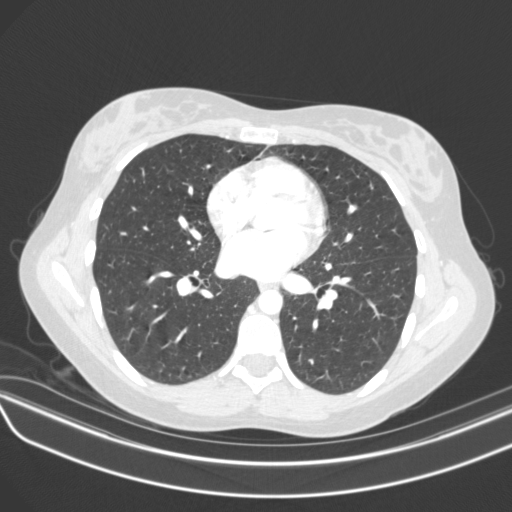
[im 223/371  mediastinal]
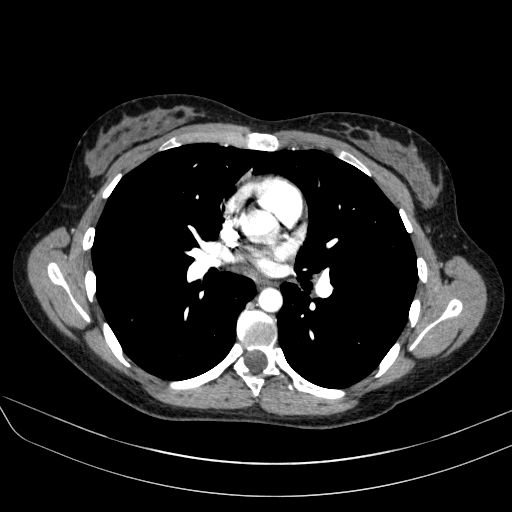
[im 241/371  lung]
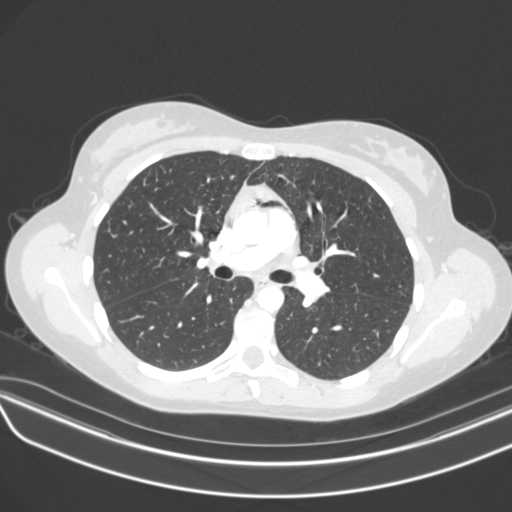
[im 247/371  mediastinal]
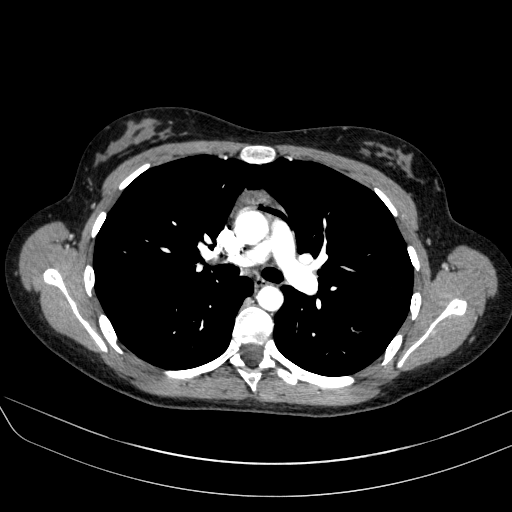
[im 260/371  lung]
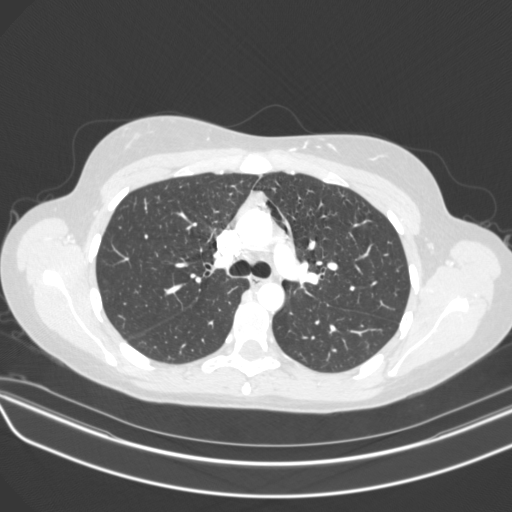
[im 278/371  mediastinal]
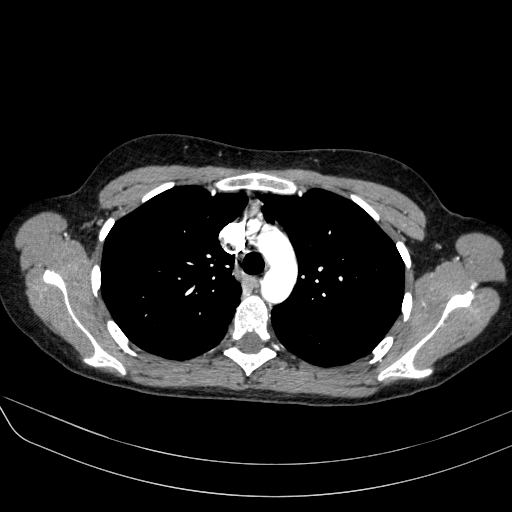
[im 315/371  lung]
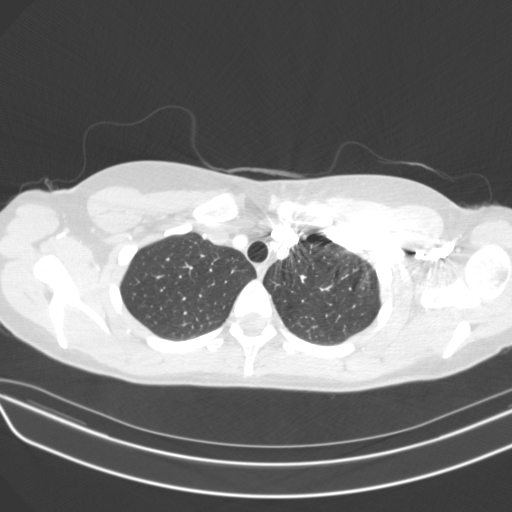
[im 334/371  mediastinal]
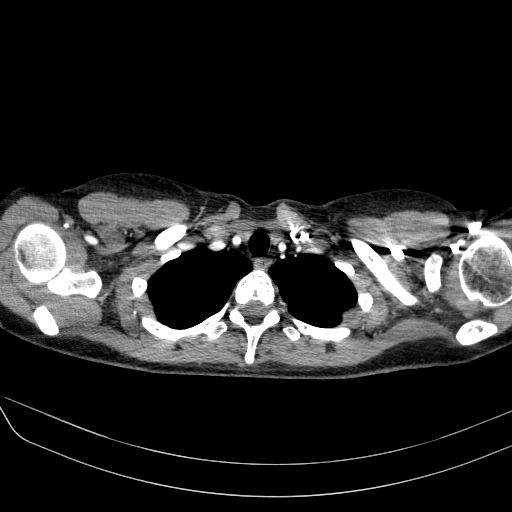
[im 352/371  lung]
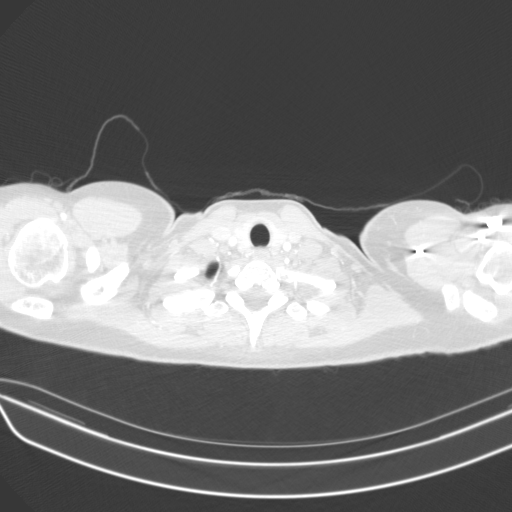

[19 of 32 positions shown; findings below may reference images not displayed]

RADIATION DOSE REDUCTION: This exam was performed according to the
departmental dose-optimization program which includes automated
exposure control, adjustment of the mA and/or kV according to
patient size and/or use of iterative reconstruction technique.

CONTRAST:  75mL [id number] IOPAMIDOL ([id number]) INJECTION 76%
FINDINGS: Cardiovascular: Satisfactory opacification of the pulmonary arteries
to the segmental level. No evidence of pulmonary embolism. Normal
heart size. No pericardial effusion.

Mediastinum/Nodes: Thyroid unremarkable. Trachea and central airways
are patent. Esophagus unremarkable and nondilated. No hiatal hernia.
No adenopathy. Trace residual thymic tissue.

Small amount of diffuse anterior chest pneumomediastinum without
clear etiology, suspect spontaneous.

Lungs/Pleura: Lungs remain clear. No pleural abnormality, effusion,
or pneumothorax.

Upper Abdomen: No acute abnormality.

Musculoskeletal: No chest wall abnormality. No acute or significant
osseous findings.

Review of the MIP images confirms the above findings.
IMPRESSION: Negative for significant acute pulmonary embolus by CTA.

Small amount of anterior chest pneumomediastinum, without clear
etiology. Suspect spontaneous.

No other acute intrathoracic finding.

## 2022-10-30 DIAGNOSIS — R079 Chest pain, unspecified: Secondary | ICD-10-CM | POA: Diagnosis not present

## 2022-10-30 DIAGNOSIS — R000 Tachycardia, unspecified: Secondary | ICD-10-CM | POA: Diagnosis not present

## 2022-10-30 DIAGNOSIS — R002 Palpitations: Secondary | ICD-10-CM | POA: Diagnosis not present

## 2022-10-30 DIAGNOSIS — R072 Precordial pain: Secondary | ICD-10-CM | POA: Diagnosis not present

## 2022-10-30 DIAGNOSIS — E876 Hypokalemia: Secondary | ICD-10-CM | POA: Diagnosis not present

## 2022-11-01 DIAGNOSIS — Z01411 Encounter for gynecological examination (general) (routine) with abnormal findings: Secondary | ICD-10-CM | POA: Diagnosis not present

## 2022-11-01 DIAGNOSIS — Z8616 Personal history of COVID-19: Secondary | ICD-10-CM | POA: Diagnosis not present

## 2022-11-01 DIAGNOSIS — Z01419 Encounter for gynecological examination (general) (routine) without abnormal findings: Secondary | ICD-10-CM | POA: Diagnosis not present

## 2022-11-01 DIAGNOSIS — N941 Unspecified dyspareunia: Secondary | ICD-10-CM | POA: Diagnosis not present

## 2022-11-01 DIAGNOSIS — Z87448 Personal history of other diseases of urinary system: Secondary | ICD-10-CM | POA: Diagnosis not present

## 2022-11-19 ENCOUNTER — Encounter: Payer: Self-pay | Admitting: Pulmonary Disease

## 2022-11-19 DIAGNOSIS — R5383 Other fatigue: Secondary | ICD-10-CM | POA: Diagnosis not present

## 2022-11-19 DIAGNOSIS — T148XXA Other injury of unspecified body region, initial encounter: Secondary | ICD-10-CM | POA: Diagnosis not present

## 2022-11-19 DIAGNOSIS — I4711 Inappropriate sinus tachycardia, so stated: Secondary | ICD-10-CM | POA: Diagnosis not present

## 2022-11-19 DIAGNOSIS — L539 Erythematous condition, unspecified: Secondary | ICD-10-CM | POA: Diagnosis not present

## 2022-11-19 DIAGNOSIS — R5381 Other malaise: Secondary | ICD-10-CM | POA: Diagnosis not present

## 2022-11-19 DIAGNOSIS — X58XXXA Exposure to other specified factors, initial encounter: Secondary | ICD-10-CM | POA: Diagnosis not present

## 2022-11-19 NOTE — Telephone Encounter (Signed)
Mychart message sent by pt: Lisa [facility] (supporting Lanier Clam, MD)1 hour ago (9:54 AM)    Good morning, I have had a rough weekend. Started having chest tightness and shortness of breath Saturday and hasn't gotten any better since. I have used my emergency inhaler all weekend and still having issues. I had a hard time eating dinner last night because I didn't have enough air. I'm currently at work and having issues. My blood pressure is 142/86 and oxygen is in the 90s, heart rate seems to be okay. I was seen in the ED a week or 2 ago and I don't want to go back if I can keep from it. Please let me know if there's anyway you can work me in. I'm scheduled to see my PCP Thursday to f/u from the ED. Thank you    Routing to Dr. Verlee Monte for review. Please advise.

## 2022-11-22 ENCOUNTER — Encounter: Payer: Self-pay | Admitting: Emergency Medicine

## 2022-11-22 ENCOUNTER — Ambulatory Visit: Payer: BC Managed Care – PPO | Admitting: Emergency Medicine

## 2022-11-22 VITALS — BP 114/72 | HR 82 | Temp 97.7°F | Ht 67.0 in | Wt 131.8 lb

## 2022-11-22 DIAGNOSIS — R0609 Other forms of dyspnea: Secondary | ICD-10-CM | POA: Diagnosis not present

## 2022-11-22 DIAGNOSIS — J4520 Mild intermittent asthma, uncomplicated: Secondary | ICD-10-CM | POA: Diagnosis not present

## 2022-11-22 DIAGNOSIS — R0600 Dyspnea, unspecified: Secondary | ICD-10-CM | POA: Insufficient documentation

## 2022-11-22 DIAGNOSIS — J45909 Unspecified asthma, uncomplicated: Secondary | ICD-10-CM | POA: Insufficient documentation

## 2022-11-22 NOTE — Patient Instructions (Signed)
We will arrange for cardiopulmonary exercise test to help further characterize your shortness of breath. We will hold off on giving any prednisone or medications for flaring symptoms at this time.  You will need to call us if you have progressive symptoms or if your breathing does not get back to your usual baseline Continue Breztri 2 puffs twice a day.  Rinse and gargle after using. Keep your albuterol available to use 2 puffs to be needed for chest tightness, shortness of breath Follow Dr. Silas Flood after your cardiopulmonary exercise testing so you can review the results together.

## 2022-11-22 NOTE — Assessment & Plan Note (Signed)
Her symptoms over the weekend did seem to be consistent with flaring asthma.  It did resolve without any adjunct medications.  As a whole however she has exertional shortness of breath that is not always associated with any clear asthma.  Her PCP has question whether we should conduct cardiopulmonary exercise testing as proposed in the past by Dr. Silas Flood.  The patient would like to get this done and I will arrange.  She can follow-up here with Dr. Silas Flood to review.

## 2022-11-22 NOTE — Progress Notes (Signed)
Subjective:    Patient ID: Lisa Lawrence, female    DOB: [birth year], 22 y.o.   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  Pulmonary function testing shows a positive bronchodilator response.  She also has a history of possible POTS with associated tachycardia.  Her overall asthma control seem to worsen as she had COVID-19 in 2021.  She has been managed on Breztri, has albuterol which she uses approximately. Today she reports that she was well until over the weekend she began doing chores including cleaning bathrooms with some cleaning solutions, cleaning furniture with significant dust exposure, vacuuming, etc.  She had more dyspnea, chest tightness, no cough, maybe some chest pain with the tightness. Used albuterol a few times, certainly more than her baseline - did help her some.  She has started to improve over the last 2 days.   Review of Systems As per HPI  No past medical history on file.   Family History  Problem Relation Age of Onset   Thyroid disease Mother    Hyperlipidemia Mother    Hyperlipidemia Father    Hypertension Father      Social History   Socioeconomic History   Marital status: Single    Spouse name: Not on file   Number of children: 0   Years of education: 12   Highest education level: Not on file  Occupational History   Occupation: [employer]  Tobacco Use   Smoking status: Never   Smokeless tobacco: Never  Vaping Use   Vaping Use: Never used  Substance and Sexual Activity   Alcohol use: Yes    Comment: OCCASIONAL   Drug use: Not Currently   Sexual activity: Yes    Birth control/protection: Pill    Comment: JUNEL FE  Other Topics Concern   Not on file  Social History Narrative   Not on file   Social Determinants of Health   Financial Resource Strain: Not on file  Food Insecurity: Not on file  Transportation Needs: Not on file   Physical Activity: Not on file  Stress: Not on file  Social Connections: Not on file  Intimate Partner Violence: Not on file     Allergies  Allergen Reactions   Amoxicillin Hives   Penicillins Hives     Outpatient Medications Prior to Visit  Medication Sig Dispense Refill   albuterol (VENTOLIN HFA) 108 (90 Base) MCG/ACT inhaler Inhale 1-2 puffs into the lungs every 6 (six) hours as needed for wheezing or shortness of breath. 18 g 5   Budeson-Glycopyrrol-Formoterol (BREZTRI AEROSPHERE) 160-9-4.8 MCG/ACT AERO Inhale 2 puffs into the lungs in the morning and at bedtime. 1 each 6   ferrous sulfate 325 (65 FE) MG tablet Take by mouth.     lisdexamfetamine (VYVANSE) 20 MG capsule Take 1 capsule by mouth every morning.     metoprolol tartrate (LOPRESSOR) 25 MG tablet Take 12.5 mg by mouth 2 (two) times daily.     Multiple Vitamin (MULTI-VITAMINS) TABS Take 1 tablet by mouth daily.     norethindrone-ethinyl estradiol-FE (LOESTRIN FE) 1-20 MG-MCG tablet Take 1 tablet by mouth daily.     Syringe/Needle, Disp, (SYRINGE 3CC/25GX1") 25G X 1" 3 ML MISC Use once a month with b12     No facility-administered medications prior to visit.        Objective:   Physical Exam Vitals:   11/22/22 [id number]  BP: 114/72  Pulse: 82  Temp: 97.7 F (36.5 C)  TempSrc: Oral  SpO2: 100%  Weight: 131 lb 12.8 oz (59.8 kg)  Height: 5' 7"$  (1.702 m)   Gen: Pleasant, thin young woman, in no distress,  normal affect  ENT: No lesions,  mouth clear,  oropharynx clear, no postnasal drip  Neck: No JVD, no stridor  Lungs: No use of accessory muscles, no crackles or wheezing on normal respiration, no wheeze on forced expiration  Cardiovascular: RRR, heart sounds normal, no murmur or gallops, no peripheral edema  Musculoskeletal: No deformities, no cyanosis or clubbing  Neuro: alert, awake, non focal  Skin: Warm, no lesions or rash        Assessment & Plan:  Asthma With intermittent symptoms and  documented bronchodilator response on pulmonary function testing.  She is on Breztri for maintenance.  She had a flare that seems to have been exposure related although some of her persistent symptoms may have been fatigability as she did more work over the weekend.  She is still somewhat short of breath and her chest tightness, wheezing are resolved.  Given her clear lung exam and improvement I think it is reasonable to hold off on prednisone for now.  Continue the Breztri and albuterol as needed.  Dyspnea Her symptoms over the weekend did seem to be consistent with flaring asthma.  It did resolve without any adjunct medications.  As a whole however she has exertional shortness of breath that is not always associated with any clear asthma.  Her PCP has question whether we should conduct cardiopulmonary exercise testing as proposed in the past by Dr. Silas Flood.  The patient would like to get this done and I will arrange.  She can follow-up here with Dr. Silas Flood to review.  Baltazar Apo, MD, PhD 11/22/2022, 10:23 AM [facility] [phone] or if no answer before 7:00PM call [phone] For any issues after 7:00PM please call eLink [phone] I did my

## 2022-11-22 NOTE — Assessment & Plan Note (Signed)
With intermittent symptoms and documented bronchodilator response on pulmonary function testing.  She is on Breztri for maintenance.  She had a flare that seems to have been exposure related although some of her persistent symptoms may have been fatigability as she did more work over the weekend.  She is still somewhat short of breath and her chest tightness, wheezing are resolved.  Given her clear lung exam and improvement I think it is reasonable to hold off on prednisone for now.  Continue the Breztri and albuterol as needed.

## 2022-11-25 DIAGNOSIS — R58 Hemorrhage, not elsewhere classified: Secondary | ICD-10-CM | POA: Insufficient documentation

## 2022-12-06 DIAGNOSIS — R519 Headache, unspecified: Secondary | ICD-10-CM | POA: Diagnosis not present

## 2022-12-06 DIAGNOSIS — J0100 Acute maxillary sinusitis, unspecified: Secondary | ICD-10-CM | POA: Diagnosis not present

## 2022-12-19 ENCOUNTER — Ambulatory Visit (HOSPITAL_COMMUNITY): Payer: BC Managed Care – PPO | Attending: Cardiology

## 2022-12-19 DIAGNOSIS — R0609 Other forms of dyspnea: Secondary | ICD-10-CM | POA: Insufficient documentation

## 2023-01-14 ENCOUNTER — Ambulatory Visit: Payer: BC Managed Care – PPO | Admitting: Pulmonary Disease

## 2023-01-14 ENCOUNTER — Encounter: Payer: Self-pay | Admitting: Pulmonary Disease

## 2023-01-14 DIAGNOSIS — J449 Chronic obstructive pulmonary disease, unspecified: Secondary | ICD-10-CM | POA: Diagnosis not present

## 2023-01-14 MED ORDER — BREZTRI AEROSPHERE 160-9-4.8 MCG/ACT IN AERO: 2.0000 | INHALATION_SPRAY | Freq: Two times a day (BID) | RESPIRATORY_TRACT | 11 refills | Status: DC

## 2023-01-14 NOTE — Patient Instructions (Addendum)
Nice to see you again  The results of your cardiopulmonary exercise test were overall fairly normal however there was indication of increased "dead space."  I will discuss with my colleagues to read the test.  My initial thought is to talk with your cardiologist and consider something like a right heart catheterization to evaluate for something like pulmonary hypertension as this could cause shortness of breath like you described.  I will be in touch in the coming days, if you have not heard from you in a few days please send me a message.  I refilled your breztri  Return to clinic in 3 months or sooner as needed

## 2023-01-16 ENCOUNTER — Encounter: Payer: Self-pay | Admitting: Pulmonary Disease

## 2023-01-16 NOTE — Telephone Encounter (Signed)
Called a spoke with pt who states she did have increased SOB, chest pain and low O2 sats last night and this morning. She stated that at this time all symptoms had improved. Pt instructed that if symptoms returned she would need to go to ED for medical evaluation. Pt stated understanding.  Pt also wanted to let Dr. Lattie Haw know she would like to move forward with the right heart cath and what the next steps would be. Dr. Judeth Horn please as advise.

## 2023-01-17 MED ORDER — PREDNISONE 20 MG PO TABS: 20.0000 mg | ORAL_TABLET | Freq: Every day | ORAL | 0 refills | Status: AC

## 2023-01-17 NOTE — Telephone Encounter (Signed)
  That will be fine, as long as it is a low dose so it doesn't jack my heart rate up. Thank you again!     Karren Burly, MD  Doug[facility] Rivers Lucas23 hours ago (2:46 PM)   MH Will do - going to reach out to my cardiology colleagues about scheduling the procedure.   Would you like to try some prednisone to see if it helps if asthma could be flaring?  Dr Judeth Horn, please advise on pred rx. He wants to try assuming his heart rate will not become too elevated. Thanks!

## 2023-01-18 ENCOUNTER — Telehealth (HOSPITAL_COMMUNITY): Payer: Self-pay

## 2023-01-18 ENCOUNTER — Other Ambulatory Visit (HOSPITAL_COMMUNITY): Payer: Self-pay

## 2023-01-18 DIAGNOSIS — R0609 Other forms of dyspnea: Secondary | ICD-10-CM

## 2023-01-18 NOTE — Telephone Encounter (Signed)
Called patient and have RHC scheduled for 04/26 24 at 2.30pm, RHC instructions given to patient, verbalized understanding. Letter of instructions mailed.

## 2023-01-28 ENCOUNTER — Encounter: Payer: Self-pay | Admitting: Pulmonary Disease

## 2023-01-31 DIAGNOSIS — N9489 Other specified conditions associated with female genital organs and menstrual cycle: Secondary | ICD-10-CM | POA: Diagnosis not present

## 2023-01-31 DIAGNOSIS — N941 Unspecified dyspareunia: Secondary | ICD-10-CM | POA: Diagnosis not present

## 2023-02-01 ENCOUNTER — Encounter (HOSPITAL_COMMUNITY)
Admission: RE | Disposition: A | Discharge status: Home / Self Care | Payer: Self-pay | Source: Home / Self Care | Attending: Cardiology

## 2023-02-01 ENCOUNTER — Ambulatory Visit (HOSPITAL_COMMUNITY)
Admission: RE | Admit: 2023-02-01 | Discharge: 2023-02-01 | Disposition: A | Discharge status: Home / Self Care | Payer: BC Managed Care – PPO | Attending: Cardiology | Admitting: Cardiology

## 2023-02-01 ENCOUNTER — Inpatient Hospital Stay (HOSPITAL_COMMUNITY)
Admit: 2023-02-01 | Discharge: 2023-02-01 | Disposition: A | Discharge status: Home / Self Care | Payer: BC Managed Care – PPO | Attending: Cardiology | Admitting: Cardiology

## 2023-02-01 ENCOUNTER — Other Ambulatory Visit: Payer: Self-pay

## 2023-02-01 DIAGNOSIS — R0609 Other forms of dyspnea: Secondary | ICD-10-CM | POA: Diagnosis not present

## 2023-02-01 DIAGNOSIS — I272 Pulmonary hypertension, unspecified: Secondary | ICD-10-CM | POA: Insufficient documentation

## 2023-02-01 DIAGNOSIS — I471 Supraventricular tachycardia, unspecified: Secondary | ICD-10-CM | POA: Insufficient documentation

## 2023-02-01 DIAGNOSIS — R0602 Shortness of breath: Secondary | ICD-10-CM

## 2023-02-01 DIAGNOSIS — R0600 Dyspnea, unspecified: Secondary | ICD-10-CM | POA: Diagnosis not present

## 2023-02-01 HISTORY — PX: RIGHT HEART CATH: CATH118263

## 2023-02-01 LAB — POCT I-STAT EG7
Acid-base deficit: 3 mmol/L — ABNORMAL HIGH (ref 0.0–2.0)
Acid-base deficit: 4 mmol/L — ABNORMAL HIGH (ref 0.0–2.0)
Acid-base deficit: 4 mmol/L — ABNORMAL HIGH (ref 0.0–2.0)
Bicarbonate: 19.2 mmol/L — ABNORMAL LOW (ref 20.0–28.0)
Bicarbonate: 18.8 mmol/L — ABNORMAL LOW (ref 20.0–28.0)
Bicarbonate: 19 mmol/L — ABNORMAL LOW (ref 20.0–28.0)
Calcium, Ion: 1.24 mmol/L (ref 1.15–1.40)
Calcium, Ion: 1.21 mmol/L (ref 1.15–1.40)
Calcium, Ion: 1.23 mmol/L (ref 1.15–1.40)
HCT: 39 % (ref 36.0–46.0)
HCT: 38 % (ref 36.0–46.0)
HCT: 38 % (ref 36.0–46.0)
Hemoglobin: 13.3 g/dL (ref 12.0–15.0)
Hemoglobin: 12.9 g/dL (ref 12.0–15.0)
Hemoglobin: 12.9 g/dL (ref 12.0–15.0)
O2 Saturation: 86 %
O2 Saturation: 82 %
O2 Saturation: 88 %
Potassium: 3.5 mmol/L (ref 3.5–5.1)
Potassium: 3.5 mmol/L (ref 3.5–5.1)
Potassium: 3.5 mmol/L (ref 3.5–5.1)
Sodium: 141 mmol/L (ref 135–145)
Sodium: 140 mmol/L (ref 135–145)
Sodium: 141 mmol/L (ref 135–145)
TCO2: 20 mmol/L — ABNORMAL LOW (ref 22–32)
TCO2: 20 mmol/L — ABNORMAL LOW (ref 22–32)
TCO2: 20 mmol/L — ABNORMAL LOW (ref 22–32)
pCO2, Ven: 27.4 mmHg — ABNORMAL LOW (ref 44–60)
pCO2, Ven: 26.6 mmHg — ABNORMAL LOW (ref 44–60)
pCO2, Ven: 26.9 mmHg — ABNORMAL LOW (ref 44–60)
pH, Ven: 7.453 — ABNORMAL HIGH (ref 7.25–7.43)
pH, Ven: 7.457 — ABNORMAL HIGH (ref 7.25–7.43)
pH, Ven: 7.458 — ABNORMAL HIGH (ref 7.25–7.43)
pO2, Ven: 48 mmHg — ABNORMAL HIGH (ref 32–45)
pO2, Ven: 43 mmHg (ref 32–45)
pO2, Ven: 50 mmHg — ABNORMAL HIGH (ref 32–45)

## 2023-02-01 LAB — CBC
HCT: 41.5 % (ref 36.0–46.0)
Hemoglobin: 14.4 g/dL (ref 12.0–15.0)
MCH: 30.1 pg (ref 26.0–34.0)
MCHC: 34.7 g/dL (ref 30.0–36.0)
MCV: 86.6 fL (ref 80.0–100.0)
Platelets: 205 10*3/uL (ref 150–400)
RBC: 4.79 MIL/uL (ref 3.87–5.11)
RDW: 11.7 % (ref 11.5–15.5)
WBC: 7.4 10*3/uL (ref 4.0–10.5)
nRBC: 0 % (ref 0.0–0.2)

## 2023-02-01 LAB — BASIC METABOLIC PANEL
Anion gap: 10 (ref 5–15)
BUN: 7 mg/dL (ref 6–20)
CO2: 22 mmol/L (ref 22–32)
Calcium: 9.5 mg/dL (ref 8.9–10.3)
Chloride: 106 mmol/L (ref 98–111)
Creatinine, Ser: 0.82 mg/dL (ref 0.44–1.00)
GFR, Estimated: >60 mL/min (ref >60)
Glucose, Bld: 102 mg/dL — ABNORMAL HIGH (ref 70–99)
Potassium: 3.4 mmol/L — ABNORMAL LOW (ref 3.5–5.1)
Sodium: 138 mmol/L (ref 135–145)

## 2023-02-01 LAB — PREGNANCY, URINE: Preg Test, Ur: NEGATIVE

## 2023-02-01 SURGERY — RIGHT HEART CATH
Anesthesia: LOCAL

## 2023-02-01 MED ORDER — ACETAMINOPHEN 325 MG PO TABS: 650.0000 mg | ORAL_TABLET | ORAL | Status: DC | PRN

## 2023-02-01 MED ORDER — SODIUM CHLORIDE 0.9% FLUSH: 3.0000 mL | Freq: Two times a day (BID) | INTRAVENOUS | Status: DC

## 2023-02-01 MED ORDER — ONDANSETRON HCL 4 MG/2ML IJ SOLN: 4.0000 mg | Freq: Four times a day (QID) | INTRAMUSCULAR | Status: DC | PRN

## 2023-02-01 MED ORDER — SODIUM CHLORIDE 0.9 % IV SOLN: INTRAVENOUS | Status: DC

## 2023-02-01 MED ORDER — LABETALOL HCL 5 MG/ML IV SOLN: 10.0000 mg | INTRAVENOUS | Status: DC | PRN

## 2023-02-01 MED ORDER — SODIUM CHLORIDE 0.9 % IV SOLN: 250.0000 mL | INTRAVENOUS | Status: DC | PRN

## 2023-02-01 MED ORDER — HYDRALAZINE HCL 20 MG/ML IJ SOLN: 10.0000 mg | INTRAMUSCULAR | Status: DC | PRN

## 2023-02-01 MED ORDER — HEPARIN (PORCINE) IN NACL 1000-0.9 UT/500ML-% IV SOLN
INTRAVENOUS | Status: DC | PRN
  Administered 2023-02-01: 500 mL

## 2023-02-01 MED ORDER — LIDOCAINE HCL (PF) 1 % IJ SOLN
INTRAMUSCULAR | Status: AC
  Filled 2023-02-01: qty 30

## 2023-02-01 MED ORDER — ADENOSINE 6 MG/2ML IV SOLN
INTRAVENOUS | Status: DC | PRN
  Administered 2023-02-01: 6 mg via INTRAVENOUS

## 2023-02-01 MED ORDER — LIDOCAINE HCL (PF) 1 % IJ SOLN
INTRAMUSCULAR | Status: DC | PRN
  Administered 2023-02-01: 2 mL via INTRADERMAL

## 2023-02-01 MED ORDER — ADENOSINE 12 MG/4ML IV SOLN
INTRAVENOUS | Status: AC
  Filled 2023-02-01: qty 4

## 2023-02-01 MED ORDER — METOPROLOL TARTRATE 5 MG/5ML IV SOLN
INTRAVENOUS | Status: AC
  Filled 2023-02-01: qty 5

## 2023-02-01 MED ORDER — METOPROLOL TARTRATE 5 MG/5ML IV SOLN
INTRAVENOUS | Status: DC | PRN
  Administered 2023-02-01 (×2): 2.5 mg via INTRAVENOUS

## 2023-02-01 MED ORDER — SODIUM CHLORIDE 0.9% FLUSH: 3.0000 mL | INTRAVENOUS | Status: DC | PRN

## 2023-02-01 SURGICAL SUPPLY — 5 items
CATH BALLN WEDGE 5F 110CM (CATHETERS) IMPLANT
KIT HEART LEFT (KITS) ×1 IMPLANT
PACK CARDIAC CATHETERIZATION (CUSTOM PROCEDURE TRAY) ×1 IMPLANT
SHEATH GLIDE SLENDER 4/5FR (SHEATH) IMPLANT
TRANSDUCER W/STOPCOCK (MISCELLANEOUS) ×1 IMPLANT

## 2023-02-01 NOTE — Discharge Instructions (Addendum)
  Brachial Site Care   This sheet gives you information about how to care for yourself after your procedure. Your health care provider may also give you more specific instructions. If you have problems or questions, contact your health care provider. What can I expect after the procedure? After the procedure, it is common to have: Bruising and tenderness at the catheter insertion area. Follow these instructions at home:  Insertion site care Follow instructions from your health care provider about how to take care of your insertion site. Make sure you: Wash your hands with soap and water before you change your bandage (dressing). If soap and water are not available, use hand sanitizer. Remove your dressing as told by your health care provider. In 24 hours Check your insertion site every day for signs of infection. Check for: Redness, swelling, or pain. Pus or a bad smell. Warmth. You may shower 24-48 hours after the procedure. Do not apply powder or lotion to the site.  Activity For 24 hours after the procedure, or as directed by your health care provider: Do not push or pull heavy objects with the affected arm. Do not drive yourself home from the hospital or clinic. You may drive 24 hours after the procedure unless your health care provider tells you not to. Do not lift anything that is heavier than 10 lb (4.5 kg), or the limit that you are told, until your health care provider says that it is safe.  For 24 hours   We will make an appointment for you with electrophysiology to assess arrhythmias.  I am going to arrange for you to wear a 2 week heart monitor as well.

## 2023-02-01 NOTE — Progress Notes (Signed)
Patient was given discharge instructions. She verbalized understanding. 

## 2023-02-01 NOTE — H&P (Signed)
Advanced Heart Failure Team History and Physical Note   PCP:  Krystal Clark, NP  PCP-Cardiology: None     Reason for Admission: RHC   HPI:    Patient has had exertional dyspnea of unexplained etiology.  CPX showed moderate functional limitations, not clearly due to the lungs and possible heart or pulmonary vascular-related.  Her pulmonologist arranged for RHC to assess filling pressures and PA pressure.   Review of Systems: [y] = yes, [ ]  = no   General: Weight gain [ ] ; Weight loss [ ] ; Anorexia [ ] ; Fatigue [ ] ; Fever [ ] ; Chills [ ] ; Weakness [ ]   Cardiac: Chest pain/pressure [ ] ; Resting SOB [ ] ; Exertional SOB [ ] ; Orthopnea [ ] ; Pedal Edema [ ] ; Palpitations [ ] ; Syncope [ ] ; Presyncope [ ] ; Paroxysmal nocturnal dyspnea[ ]   Pulmonary: Cough [ ] ; Wheezing[ ] ; Hemoptysis[ ] ; Sputum [ ] ; Snoring [ ]   GI: Vomiting[ ] ; Dysphagia[ ] ; Melena[ ] ; Hematochezia [ ] ; Heartburn[ ] ; Abdominal pain [ ] ; Constipation [ ] ; Diarrhea [ ] ; BRBPR [ ]   GU: Hematuria[ ] ; Dysuria [ ] ; Nocturia[ ]   Vascular: Pain in legs with walking [ ] ; Pain in feet with lying flat [ ] ; Non-healing sores [ ] ; Stroke [ ] ; TIA [ ] ; Slurred speech [ ] ;  Neuro: Headaches[ ] ; Vertigo[ ] ; Seizures[ ] ; Paresthesias[ ] ;Blurred vision [ ] ; Diplopia [ ] ; Vision changes [ ]   Ortho/Skin: Arthritis [ ] ; Joint pain [ ] ; Muscle pain [ ] ; Joint swelling [ ] ; Back Pain [ ] ; Rash [ ]   Psych: Depression[ ] ; Anxiety[ ]   Heme: Bleeding problems [ ] ; Clotting disorders [ ] ; Anemia [ ]   Endocrine: Diabetes [ ] ; Thyroid dysfunction[ ]    Home Medications Prior to Admission medications   Medication Sig Start Date End Date Taking? Authorizing Provider  albuterol (VENTOLIN HFA) 108 (90 Base) MCG/ACT inhaler Inhale 1-2 puffs into the lungs every 6 (six) hours as needed for wheezing or shortness of breath. 07/02/22  Yes Hunsucker, Lesia Sago, MD  Ascorbic Acid (VITAMIN C PO) Take 1 tablet by mouth daily. Elderberry   Yes [provider]  Budeson-Glycopyrrol-Formoterol (BREZTRI AEROSPHERE) 160-9-4.8 MCG/ACT AERO Inhale 2 puffs into the lungs in the morning and at bedtime. 01/14/23  Yes Hunsucker, Lesia Sago, MD  Cyanocobalamin (VITAMIN B-12 IJ) Inject as directed every 14 (fourteen) days.   Yes [provider]  ferrous sulfate 325 (65 FE) MG tablet Take 325 mg by mouth daily with breakfast.   Yes [provider]  MAGNESIUM PO Take 1 tablet by mouth daily.   Yes [provider]  metoprolol tartrate (LOPRESSOR) 25 MG tablet Take 12.5 mg by mouth 2 (two) times daily.   Yes [provider]  TURMERIC PO Take 1 tablet by mouth daily.   Yes [provider]  Syringe/Needle, Disp, (SYRINGE 3CC/25GX1") 25G X 1" 3 ML MISC Use once a month with b12 04/18/21   [provider]    Past Medical History: No past medical history on file.  Past Surgical History: Past Surgical History:  Procedure Laterality Date   WISDOM TOOTH EXTRACTION      Family History:  Family History  Problem Relation Age of Onset   Thyroid disease Mother    Hyperlipidemia Mother    Hyperlipidemia Father    Hypertension Father     Social History: Social History   Socioeconomic History   Marital status: Single    Spouse name: Not on file  Number of children: 0   Years of education: 12   Highest education level: Not on file  Occupational History   Occupation: [employer]  Tobacco Use   Smoking status: Never   Smokeless tobacco: Never  Vaping Use   Vaping Use: Never used  Substance and Sexual Activity   Alcohol use: Yes    Comment: OCCASIONAL   Drug use: Not Currently   Sexual activity: Yes    Birth control/protection: Pill    Comment: JUNEL FE  Other Topics Concern   Not on file  Social History Narrative   Not on file   Social Determinants of Health   Financial Resource Strain: Not on file  Food Insecurity: Not on file  Transportation Needs: Not on file  Physical  Activity: Not on file  Stress: Not on file  Social Connections: Not on file    Allergies:  Allergies  Allergen Reactions   Amoxicillin Hives   Penicillins Hives    Objective:    Vital Signs:   Temp:  [98.3 F (36.8 C)] 98.3 F (36.8 C) (04/26 1233) Pulse Rate:  [103] 103 (04/26 1233) Resp:  [16] 16 (04/26 1233) SpO2:  [99 %] 99 % (04/26 1233) Weight:  [60.3 kg] 60.3 kg (04/26 1233)   Filed Weights   02/01/23 1233  Weight: 60.3 kg     Physical Exam     General:  Well appearing. No respiratory difficulty HEENT: Normal Neck: Supple. no JVD. Carotids 2+ bilat; no bruits. No lymphadenopathy or thyromegaly appreciated. Cor: PMI nondisplaced. Regular rate & rhythm. No rubs, gallops or murmurs. Lungs: Clear Abdomen: Soft, nontender, nondistended. No hepatosplenomegaly. No bruits or masses. Good bowel sounds. Extremities: No cyanosis, clubbing, rash, edema Neuro: Alert & oriented x 3, cranial nerves grossly intact. moves all 4 extremities w/o difficulty. Affect pleasant.   Labs     Basic Metabolic Panel: Recent Labs  Lab 02/01/23 1245  NA 138  K 3.4*  CL 106  CO2 22  GLUCOSE 102*  BUN 7  CREATININE 0.82  CALCIUM 9.5    Liver Function Tests: No results for input(s): "AST", "ALT", "ALKPHOS", "BILITOT", "PROT", "ALBUMIN" in the last 168 hours. No results for input(s): "LIPASE", "AMYLASE" in the last 168 hours. No results for input(s): "AMMONIA" in the last 168 hours.  CBC: Recent Labs  Lab 02/01/23 1245  WBC 7.4  HGB 14.4  HCT 41.5  MCV 86.6  PLT 205    Cardiac Enzymes: No results for input(s): "CKTOTAL", "CKMB", "CKMBINDEX", "TROPONINI" in the last 168 hours.  BNP: BNP (last 3 results) No results for input(s): "BNP" in the last 8760 hours.  ProBNP (last 3 results) No results for input(s): "PROBNP" in the last 8760 hours.   CBG: No results for input(s): "GLUCAP" in the last 168 hours.  Coagulation Studies: No results for input(s):  "LABPROT", "INR" in the last 72 hours.  Imaging: No results found.   Assessment/Plan   RHC today for evaluation of unexplained dyspnea with possible cardiac cause based on CPX.    Marca Ancona, MD 02/01/2023, 2:42 PM  Advanced Heart Failure Team Pager [phone] (M-F; 7a - 5p)  Please contact [facility] for night-coverage after hours (4p -7a ) and weekends on [email or web address]

## 2023-02-04 ENCOUNTER — Encounter (HOSPITAL_COMMUNITY): Payer: Self-pay | Admitting: Cardiology

## 2023-02-21 DIAGNOSIS — I471 Supraventricular tachycardia, unspecified: Secondary | ICD-10-CM | POA: Diagnosis not present

## 2023-02-21 NOTE — Addendum Note (Signed)
Encounter addended by: Crissie Figures, RN on: 02/21/2023 9:39 AM  Actions taken: Imaging Exam ended

## 2023-02-25 ENCOUNTER — Encounter: Payer: Self-pay | Admitting: Cardiovascular Disease

## 2023-02-25 ENCOUNTER — Ambulatory Visit: Payer: BC Managed Care – PPO | Attending: Cardiovascular Disease | Admitting: Cardiovascular Disease

## 2023-02-25 VITALS — BP 110/62 | HR 78 | Ht 67.0 in | Wt 128.0 lb

## 2023-02-25 DIAGNOSIS — I471 Supraventricular tachycardia, unspecified: Secondary | ICD-10-CM

## 2023-02-25 NOTE — Progress Notes (Signed)
Electrophysiology Office Note:    Date:  02/25/2023   ID:  Lisa Lawrence, DOB 11-12-2000, MRN [id number]  PCP:  Krystal Clark, NP   [facility] Providers Cardiologist:  None     Referring MD: Rhea Bleacher*   History of Present Illness:    Lisa Lawrence is a 22 y.o. female with relatively little past medical history referred for arrhythmia management.  She has been battling shortness of breath, fatigue, and palpitations since COVID infection. She was referred for a right heart cath after her pulmonary evaluation was unremarkable. At the time of her procedure, she converted to an SVT that was responsive to adenosine and vagal maneuvers.  She has had multiple other episodes with identical symptoms. She also is easily fatigued and short of breath -- unrelated to her palpitations.  Betablocker was started, and she has been having fewer palpitations since.     History reviewed. No pertinent past medical history.  Past Surgical History:  Procedure Laterality Date   RIGHT HEART CATH N/A 02/01/2023   Procedure: RIGHT HEART CATH;  Surgeon: Laurey Morale, MD;  Location: [facility] INVASIVE CV LAB;  Service: Cardiovascular;  Laterality: N/A;   WISDOM TOOTH EXTRACTION      Current Medications: Current Meds  Medication Sig   albuterol (VENTOLIN HFA) 108 (90 Base) MCG/ACT inhaler Inhale 1-2 puffs into the lungs every 6 (six) hours as needed for wheezing or shortness of breath.   Ascorbic Acid (VITAMIN C PO) Take 1 tablet by mouth daily. Elderberry   Budeson-Glycopyrrol-Formoterol (BREZTRI AEROSPHERE) 160-9-4.8 MCG/ACT AERO Inhale 2 puffs into the lungs in the morning and at bedtime.   Cyanocobalamin (VITAMIN B-12 IJ) Inject as directed every 14 (fourteen) days.   ferrous sulfate 325 (65 FE) MG tablet Take 325 mg by mouth daily with breakfast.   MAGNESIUM PO Take 1 tablet by mouth daily.   metoprolol tartrate (LOPRESSOR) 25 MG tablet Take 12.5 mg by  mouth 2 (two) times daily.   Syringe/Needle, Disp, (SYRINGE 3CC/25GX1") 25G X 1" 3 ML MISC Use once a month with b12   TURMERIC PO Take 1 tablet by mouth daily.     Allergies:   Amoxicillin and Penicillins   Social and Family History: Reviewed in Epic  ROS:   Please see the history of present illness.    All other systems reviewed and are negative.  EKGs/Labs/Other Studies Reviewed Today:    Echocardiogram:     Monitors:  14 day Zio, 02/2023 - my interpretation Sinus rhythm HR 49-153, avg 79 < 1% PVCs Patient triggered events were associated with sinus rhythm  Stress testing:  CPX 12/19/2022 Exercise testing with gas exchange showed moderate functional defect.  Advanced imaging:   Cardiac catherization    EKG:  Last EKG results: today - sinus rhythm   Recent Labs: 02/01/2023: BUN 7; Creatinine, Ser 0.82; Hemoglobin 12.9; Platelets 205; Potassium 3.5; Sodium 140     Physical Exam:    VS:  BP 110/62   Pulse 78   Ht 5\' 7"  (1.702 m)   Wt 128 lb (58.1 kg)   LMP 01/29/2023   SpO2 96%   BMI 20.05 kg/m     Wt Readings from Last 3 Encounters:  02/25/23 128 lb (58.1 kg)  02/01/23 133 lb (60.3 kg)  01/14/23 133 lb 9.6 oz (60.6 kg)     GEN: Well nourished, well developed in no acute distress CARDIAC: RRR, no murmurs, rubs, gallops RESPIRATORY:  Normal work of breathing  MUSCULOSKELETAL: no edema    ASSESSMENT & PLAN:    SVT Unfortunately, we don't have ECG documentation of the arrhythmia I have asked her to obtain a KardiaMobile device and capture a rhythm strip when she has recurrence of symptoms. I anticipate we will schedule her for an EP study and ablation when we have documentation of the SVT Continue metoprolol          Medication Adjustments/Labs and Tests Ordered: Current medicines are reviewed at length with the patient today.  Concerns regarding medicines are outlined above.  No orders of the defined types were placed in this  encounter.  No orders of the defined types were placed in this encounter.    Signed, Maurice Small, MD  02/25/2023 12:18 PM    [facility]

## 2023-02-25 NOTE — Patient Instructions (Signed)
Medication Instructions:  Your physician recommends that you continue on your current medications as directed. Please refer to the Current Medication list given to you today. *If you need a refill on your cardiac medications before your next appointment, please call your pharmacy*   Follow-Up: At [facility], you and your health needs are our priority.  As part of our continuing mission to provide you with exceptional heart care, we have created designated Provider Care Teams.  These Care Teams include your primary Cardiologist (physician) and Advanced Practice Providers (APPs -  Physician Assistants and Nurse Practitioners) who all work together to provide you with the care you need, when you need it.  We recommend signing up for the patient portal called "MyChart".  Sign up information is provided on this After Visit Summary.  MyChart is used to connect with patients for Virtual Visits (Telemedicine).  Patients are able to view lab/test results, encounter notes, upcoming appointments, etc.  Non-urgent messages can be sent to your provider as well.   To learn more about what you can do with MyChart, go to [email or web address].    Your next appointment:   3 month(s)  Provider:   Augustus Mealor, MD  

## 2023-03-06 DIAGNOSIS — M6289 Other specified disorders of muscle: Secondary | ICD-10-CM | POA: Diagnosis not present

## 2023-03-06 DIAGNOSIS — R102 Pelvic and perineal pain: Secondary | ICD-10-CM | POA: Diagnosis not present

## 2023-03-06 DIAGNOSIS — N941 Unspecified dyspareunia: Secondary | ICD-10-CM | POA: Diagnosis not present

## 2023-03-25 DIAGNOSIS — N941 Unspecified dyspareunia: Secondary | ICD-10-CM | POA: Diagnosis not present

## 2023-03-25 DIAGNOSIS — M6289 Other specified disorders of muscle: Secondary | ICD-10-CM | POA: Diagnosis not present

## 2023-03-25 DIAGNOSIS — R102 Pelvic and perineal pain: Secondary | ICD-10-CM | POA: Diagnosis not present

## 2023-03-29 ENCOUNTER — Encounter: Payer: Self-pay | Admitting: Pulmonary Disease

## 2023-04-05 ENCOUNTER — Other Ambulatory Visit: Payer: Self-pay

## 2023-04-05 MED ORDER — ALBUTEROL SULFATE HFA 108 (90 BASE) MCG/ACT IN AERS: 1.0000 | INHALATION_SPRAY | Freq: Four times a day (QID) | RESPIRATORY_TRACT | 6 refills | Status: DC | PRN

## 2023-04-11 DIAGNOSIS — N3090 Cystitis, unspecified without hematuria: Secondary | ICD-10-CM | POA: Diagnosis not present

## 2023-04-15 DIAGNOSIS — R233 Spontaneous ecchymoses: Secondary | ICD-10-CM | POA: Diagnosis not present

## 2023-04-15 DIAGNOSIS — E611 Iron deficiency: Secondary | ICD-10-CM | POA: Diagnosis not present

## 2023-04-15 DIAGNOSIS — T148XXA Other injury of unspecified body region, initial encounter: Secondary | ICD-10-CM | POA: Diagnosis not present

## 2023-05-07 DIAGNOSIS — M6289 Other specified disorders of muscle: Secondary | ICD-10-CM | POA: Diagnosis not present

## 2023-05-07 DIAGNOSIS — R102 Pelvic and perineal pain: Secondary | ICD-10-CM | POA: Diagnosis not present

## 2023-05-07 DIAGNOSIS — N941 Unspecified dyspareunia: Secondary | ICD-10-CM | POA: Diagnosis not present

## 2023-05-27 ENCOUNTER — Encounter: Payer: Self-pay | Admitting: Cardiovascular Disease

## 2023-05-27 ENCOUNTER — Ambulatory Visit: Payer: BC Managed Care – PPO | Attending: Cardiovascular Disease | Admitting: Cardiovascular Disease

## 2023-05-27 VITALS — BP 114/78 | HR 81 | Ht 67.0 in | Wt 134.2 lb

## 2023-05-27 DIAGNOSIS — I471 Supraventricular tachycardia, unspecified: Secondary | ICD-10-CM | POA: Diagnosis not present

## 2023-05-27 NOTE — Patient Instructions (Signed)

## 2023-05-27 NOTE — Progress Notes (Signed)
  Electrophysiology Office Note:    Date:  05/27/2023   ID:  Lisa Lawrence, DOB 06-29-2001, MRN [id number]  PCP:  Krystal Clark, NP   [facility] Providers Cardiologist:  None Electrophysiologist:  Maurice Small, MD     Referring MD: Rhea Bleacher*   History of Present Illness:    Lisa Lawrence is a 22 y.o. female with relatively little past medical history referred for arrhythmia management.  She has been battling shortness of breath, fatigue, and palpitations since COVID infection. She was referred for a right heart cath after her pulmonary evaluation was unremarkable. At the time of her procedure, she converted to an SVT that was responsive to adenosine and vagal maneuvers.  She has had multiple other episodes with identical symptoms. She also is easily fatigued and short of breath -- unrelated to her palpitations.  Betablocker was started, and she has been having fewer palpitations since.    In follow-up today, she reports that she has not had any episodes resembling the SVT she experienced during her right heart cath.  She has noticed a few occasional elevated heart rates, and she has captured some tracings with her cardia mobile device.  I reviewed these today; they showed normal sinus rhythm and sinus tachycardia.   EKGs/Labs/Other Studies Reviewed Today:    Echocardiogram:     Monitors:  14 day Zio, 02/2023 - my interpretation Sinus rhythm HR 49-153, avg 79 < 1% PVCs Patient triggered events were associated with sinus rhythm  Stress testing:  CPX 12/19/2022 Exercise testing with gas exchange showed moderate functional defect.  Advanced imaging:   Cardiac catherization    EKG:  Last EKG results: today - sinus rhythm   Recent Labs: 02/01/2023: BUN 7; Creatinine, Ser 0.82; Hemoglobin 12.9; Platelets 205; Potassium 3.5; Sodium 140     Physical Exam:    VS:  BP 114/78 (BP Location: Left Arm, Patient Position:  Sitting, Cuff Size: Normal)   Pulse 81   Ht 5\' 7"  (1.702 m)   Wt 134 lb 3.2 oz (60.9 kg)   SpO2 98%   BMI 21.02 kg/m     Wt Readings from Last 3 Encounters:  05/27/23 134 lb 3.2 oz (60.9 kg)  02/25/23 128 lb (58.1 kg)  02/01/23 133 lb (60.3 kg)     GEN: Well nourished, well developed in no acute distress CARDIAC: RRR, no murmurs, rubs, gallops RESPIRATORY:  Normal work of breathing MUSCULOSKELETAL: no edema    ASSESSMENT & PLAN:    SVT Witnessed during heart cath, converted with adenosine and then cough Unfortunately, we don't have ECG documentation of the arrhythmia She will continue to monitor with her Kardia device I anticipate we will schedule her for an EP study and ablation when we have documentation of the SVT Continue metoprolol          Medication Adjustments/Labs and Tests Ordered: Current medicines are reviewed at length with the patient today.  Concerns regarding medicines are outlined above.  Orders Placed This Encounter  Procedures   EKG 12-Lead   No orders of the defined types were placed in this encounter.    Signed, Maurice Small, MD  05/27/2023 3:46 PM    [facility]

## 2023-06-08 NOTE — Progress Notes (Signed)
@Patient  ID: Lisa Lawrence, female    DOB: [birth year], 22 y.o.   MRN: [id number]  Chief Complaint  Patient presents with   Follow-up    Pt is here today for follow up to go over her test results for her CPST test. Pt is also on Breztri for her asthma and states that it is working well.     Referring provider: Rhea Bleacher*  HPI:   22 y.o. woman whom are seen in follow up evaluation of dyspnea on exertion.  Most recent Cardiology note from [facility] reviewed.  On Breztri.  Overall symptoms improved.  Still with occasional flushing dizziness lightheadedness.  Shortness of breath.  Discussed increased dose based on recent cardiopulmonary exercise test.  Reviewed at length.  As such reasonable to look at possible pulmonary vascular disease with right heart catheter is not present or signs not being seen on echocardiogram or PFTs.   HPI at initial visit: Patient contracted COVID about 2 years ago.  Prior to that no issues.  Avid an active exerciser.  Very athletic playing sports in high school.  Since then has had marked dyspnea.  Associate with chest tightness.  Present on flat surfaces with short distances.  Worse on inclines or stairs.  No time of day that seems to make things better or worse.  No position makes it better or worse.  She does note triggers such as candle smoke, fire smoke, other scents that sometimes trigger and make the symptoms come on.  She has been using Flovent 1 puff twice a day with mild improvement in symptoms.  Uses albuterol intermittently sometimes helps.  Sometimes does not.  Does not help immediately.  She also reports tachycardia.  Heart rates up to 160s just walking around.  Out her portion to level of exertion.  Being seen by cardiology for this.  Recently prescribed beta-blocker.  Chest imaging includes CT scan 10/2021 personally reviewed and interpreted a small amount of pneumomediastinum, otherwise clear lungs no evidence of emphysema or  abnormality in the parenchyma.  Repeat CT scan 02/2022 report in care everywhere of clear lungs and resolution of pneumomediastinum.  She had PFTs 11/2021, reviewed without evidence obstruction, DLCO within normal limits.  Cannot see lung volumes.  Labs in the past without elevation in eosinophils.  PMH: No significant illness Surgical history: Wisdom tooth extraction Family history: Mother with hyperlipidemia, thyroid disease, father with hyperlipidemia, hypertension Social history: Never smoker, works at [employer] / Pulmonary Flowsheets:   ACT:      No data to display          MMRC:     No data to display          Epworth:      No data to display          Tests:   FENO:  No results found for: "NITRICOXIDE"  PFT:     No data to display          WALK:      No data to display          Imaging: Personally reviewed and as per EMR and discussion in this note No results found.  Lab Results: Personally reviewed, no elevation of eosinophils CBC    Component Value Date/Time   WBC 7.4 02/01/2023 1245   RBC 4.79 02/01/2023 1245   HGB 12.9 02/01/2023 1511   HCT 38.0 02/01/2023 1511   PLT 205 02/01/2023 1245   MCV 86.6  02/01/2023 1245   MCH 30.1 02/01/2023 1245   MCHC 34.7 02/01/2023 1245   RDW 11.7 02/01/2023 1245    BMET    Component Value Date/Time   NA 140 02/01/2023 1511   K 3.5 02/01/2023 1511   CL 106 02/01/2023 1245   CO2 22 02/01/2023 1245   GLUCOSE 102 (H) 02/01/2023 1245   BUN 7 02/01/2023 1245   CREATININE 0.82 02/01/2023 1245   CALCIUM 9.5 02/01/2023 1245   GFRNONAA >60 02/01/2023 1245    BNP No results found for: "BNP"  ProBNP No results found for: "PROBNP"  Specialty Problems       Pulmonary Problems   Asthma   Dyspnea    Allergies  Allergen Reactions   Amoxicillin Hives   Penicillins Hives    Immunization History  Administered Date(s) Administered   DTaP 05/21/2000, 07/22/2000,  09/23/2000, 11/17/2001, 03/27/2004   HIB (PRP-T) 05/21/2000, 07/22/2000, 09/23/2000, 06/30/2001   HIB, Unspecified 05/21/2000, 07/22/2000, 09/23/2000, 06/30/2001   HPV Quadrivalent 06/08/2011, 08/10/2011, 02/11/2012   Hepatitis B, PED/ADOLESCENT 08/08/00, 04/16/2000, 09/23/2000   IPV 05/21/2000, 07/22/2000, 11/17/2001, 03/27/2004   Influenza,inj,Quad PF,6+ Mos 08/30/2017, 09/02/2018   MMR 03/26/2001, 03/27/2004   Meningococcal Acwy, Unspecified 06/16/2012   Meningococcal Conjugate 06/16/2012, 08/30/2017   Pneumococcal Conjugate-13 05/21/2000, 07/22/2000, 09/23/2000, 11/17/2001   Tdap 05/29/2010   Varicella 03/26/2001, 03/15/2008    History reviewed. No pertinent past medical history.  Tobacco History: Social History   Tobacco Use  Smoking Status Never  Smokeless Tobacco Never   Counseling given: Not Answered   Continue to not smoke  Outpatient Encounter Medications as of 01/14/2023  Medication Sig   ferrous sulfate 325 (65 FE) MG tablet Take 325 mg by mouth daily with breakfast.   metoprolol tartrate (LOPRESSOR) 25 MG tablet Take 12.5 mg by mouth 2 (two) times daily.   Syringe/Needle, Disp, (SYRINGE 3CC/25GX1") 25G X 1" 3 ML MISC Use once a month with b12   [DISCONTINUED] albuterol (VENTOLIN HFA) 108 (90 Base) MCG/ACT inhaler Inhale 1-2 puffs into the lungs every 6 (six) hours as needed for wheezing or shortness of breath.   [DISCONTINUED] Budeson-Glycopyrrol-Formoterol (BREZTRI AEROSPHERE) 160-9-4.8 MCG/ACT AERO Inhale 2 puffs into the lungs in the morning and at bedtime.   [DISCONTINUED] Multiple Vitamin (MULTI-VITAMINS) TABS Take 1 tablet by mouth daily.   Budeson-Glycopyrrol-Formoterol (BREZTRI AEROSPHERE) 160-9-4.8 MCG/ACT AERO Inhale 2 puffs into the lungs in the morning and at bedtime.   [DISCONTINUED] lisdexamfetamine (VYVANSE) 20 MG capsule Take 1 capsule by mouth every morning.   [DISCONTINUED] norethindrone-ethinyl estradiol-FE (LOESTRIN FE) 1-20 MG-MCG tablet Take 1  tablet by mouth daily.   No facility-administered encounter medications on file as of 01/14/2023.     Review of Systems  Review of Systems  N/a Physical Exam  BP 116/64 (BP Location: Left Arm, Patient Position: Sitting, Cuff Size: Normal)   Pulse 64   Wt 133 lb 9.6 oz (60.6 kg)   SpO2 99%   BMI 20.92 kg/m   Wt Readings from Last 5 Encounters:  05/27/23 134 lb 3.2 oz (60.9 kg)  02/25/23 128 lb (58.1 kg)  02/01/23 133 lb (60.3 kg)  01/14/23 133 lb 9.6 oz (60.6 kg)  11/22/22 131 lb 12.8 oz (59.8 kg)    BMI Readings from Last 5 Encounters:  05/27/23 21.02 kg/m  02/25/23 20.05 kg/m  02/01/23 20.83 kg/m  01/14/23 20.92 kg/m  11/22/22 20.64 kg/m     Physical Exam General: Well-appearing, no acute distress Eyes: EOMI, icterus Neck: Supple, no JVP Pulmonary: Clear,  normal work of breathing Cardiovascular: Regular rate and rhythm, no murmur Abdomen: Nondistended, sounds present MSK: Vascular joint fusion Neuro: Normal gait, no weakness Psych: Normal mood, full affect   Assessment & Plan:   Dyspnea on exertion, chest tightness: Following COVID infection.  High suspicion for development of asthma.  Chest imaging clear.  PFTs 2/23 via [facility] with normal FEV1: FVC ratio, DLCO 100% predicted, but with significant bronchodilator response.  Her symptoms certainly seem out of proportion to this alone.  Do suspect some underlying tachycardic or inappropriate high heart rate limiting her.  Cardiopulmonary exercise test with largely normal possible increased.  Discussed with patient she agrees with moving forward with right heart catheterization.  Asthma: Clear triggers.  Mild atopic symptoms.  Worsened after COVID infection.  Mild improvement with inhalers although not much.  Bronchodilator spots on PFTs.  Trelegy caused throat hoarseness, soreness.  Breztri was prescribed with resolution of the side effects.  Mild improvement in symptoms with inhaler therapy.   Refilled today.  Continue Breztri 2 puff twice daily and albuterol as needed.  She has not used albuterol very much.  Return in about 3 months (around 04/15/2023).   Karren Burly, MD 06/08/2023

## 2023-07-01 DIAGNOSIS — M6289 Other specified disorders of muscle: Secondary | ICD-10-CM | POA: Diagnosis not present

## 2023-07-01 DIAGNOSIS — N941 Unspecified dyspareunia: Secondary | ICD-10-CM | POA: Diagnosis not present

## 2023-07-01 DIAGNOSIS — R102 Pelvic and perineal pain: Secondary | ICD-10-CM | POA: Diagnosis not present

## 2023-07-09 ENCOUNTER — Encounter: Payer: Self-pay | Admitting: Pulmonary Disease

## 2023-07-09 DIAGNOSIS — R0609 Other forms of dyspnea: Secondary | ICD-10-CM

## 2023-07-11 ENCOUNTER — Telehealth (HOSPITAL_COMMUNITY): Payer: Self-pay

## 2023-07-11 ENCOUNTER — Encounter (HOSPITAL_COMMUNITY): Payer: Self-pay

## 2023-07-11 NOTE — Telephone Encounter (Signed)
Pt called PR to change orientation date and time. Pt will come in for orientation on 07/15/23 @ 10:30.

## 2023-07-11 NOTE — Telephone Encounter (Signed)
Called patient to see if she was interested in participating in the Pulmonary Rehab Program. Patient stated yes. Patient will come in for orientation on 07/22/23 @ 8AM and will attend the 8:15AM exercise class.   Pensions consultant.

## 2023-07-11 NOTE — Telephone Encounter (Signed)
Attempted to call patient in regards to Pulmonary Rehab - LM on VM Mailed letter 

## 2023-07-11 NOTE — Telephone Encounter (Signed)
Pt insurance is active and benefits verified through [employer]. Co-pay $0.00, DED $600.00/$600.00 met, out of pocket $5,000.00/$2,962.41 met, co-insurance 20%. No pre-authorization required. Megan M./[employer], 07/11/23 @ 2:42PM, ZOX#[id number]

## 2023-07-12 ENCOUNTER — Telehealth (HOSPITAL_COMMUNITY): Payer: Self-pay

## 2023-07-12 MED ORDER — PREDNISONE 10 MG PO TABS: 10.0000 mg | ORAL_TABLET | Freq: Every day | ORAL | 0 refills | Status: AC

## 2023-07-12 NOTE — Telephone Encounter (Signed)
Called to confirm appt. Pt confirmed appt. Instructed pt on proper footwear. Gave directions along with department number.

## 2023-07-12 NOTE — Telephone Encounter (Signed)
Dr. Judeth Horn, please see recent mychart message sent by pt.

## 2023-07-15 ENCOUNTER — Encounter (HOSPITAL_COMMUNITY): Payer: Self-pay

## 2023-07-15 ENCOUNTER — Encounter (HOSPITAL_COMMUNITY)
Admission: RE | Admit: 2023-07-15 | Discharge: 2023-07-15 | Disposition: A | Discharge status: Home / Self Care | Payer: BC Managed Care – PPO | Source: Ambulatory Visit | Attending: Pulmonary Disease | Admitting: Pulmonary Disease

## 2023-07-15 VITALS — BP 104/70 | HR 72 | Wt 130.5 lb

## 2023-07-15 DIAGNOSIS — U099 Post covid-19 condition, unspecified: Secondary | ICD-10-CM

## 2023-07-15 DIAGNOSIS — R0609 Other forms of dyspnea: Secondary | ICD-10-CM | POA: Diagnosis present

## 2023-07-15 NOTE — Progress Notes (Signed)
Lisa Lawrence 22 y.o. female Pulmonary Rehab Orientation Note This patient who was referred to Pulmonary Rehab by Dr. Judeth Horn with the diagnosis of DOE/COVID19 arrived today in Cardiac and Pulmonary Rehab. She  arrived ambulatory with normal gait. She  does not carry portable oxygen. Per patient, Lisa Lawrence uses oxygen never. Color good, skin warm and dry. Patient is oriented to time and place. Patient's medical history, psychosocial health, and medications reviewed. Psychosocial assessment reveals patient lives with family. Lisa Lawrence is currently full time job doing working at [employer]. Patient hobbies include spending time with others and shopping. Patient reports her stress level is low. Areas of stress/anxiety include health. Patient does not exhibit signs of depression. PHQ2/9 score 1/2. Lisa Lawrence shows good  coping skills with positive outlook on life. Offered emotional support and reassurance. Will continue to monitor. Physical assessment performed by Nurse pick: Lisa Hart RN. Please see their orientation physical assessment note. Lisa Lawrence reports she does take medications as prescribed. Patient states she follows a regular  diet. The patient reports no specific efforts to gain or lose weight.. Patient's weight will be monitored closely. Demonstration and practice of PLB using pulse oximeter. Lisa Lawrence able to return demonstration satisfactorily. Safety and hand hygiene in the exercise area reviewed with patient. Lisa Lawrence voices understanding of the information reviewed. Department expectations discussed with patient and achievable goals were set. The patient shows enthusiasm about attending the program and we look forward to working with [facility]. Lisa Lawrence completed a 6 min walk test today and is scheduled to begin exercise on 07/23/23 @8 :15.   [id number] Lisa Lawrence, Lisa Lawrence

## 2023-07-15 NOTE — Progress Notes (Signed)
Pulmonary Individual Treatment Plan  Patient Details  Name: Lisa Lawrence MRN: [id number] Date of Birth: [birth year] Referring Provider:   Doristine Devoid Pulmonary Rehab Walk Test from 07/15/2023 in [facility]  Referring Provider Hunsucker       Initial Encounter Date:  Flowsheet Row Pulmonary Rehab Walk Test from 07/15/2023 in [facility]  Date 07/15/23       Visit Diagnosis: DOE (dyspnea on exertion)  Post covid-19 condition, unspecified  Patient's Home Medications on Admission:   Current Outpatient Medications:    albuterol (VENTOLIN HFA) 108 (90 Base) MCG/ACT inhaler, Inhale 1-2 puffs into the lungs every 6 (six) hours as needed for wheezing or shortness of breath., Disp: 18 g, Rfl: 6   Ascorbic Acid (VITAMIN C PO), Take 1 tablet by mouth daily. Elderberry, Disp: , Rfl:    Budeson-Glycopyrrol-Formoterol (BREZTRI AEROSPHERE) 160-9-4.8 MCG/ACT AERO, Inhale 2 puffs into the lungs in the morning and at bedtime., Disp: 1 each, Rfl: 11   Cyanocobalamin (VITAMIN B-12 IJ), Inject as directed every 14 (fourteen) days., Disp: , Rfl:    ferrous sulfate 325 (65 FE) MG tablet, Take 325 mg by mouth daily with breakfast., Disp: , Rfl:    MAGNESIUM PO, Take 1 tablet by mouth daily., Disp: , Rfl:    metoprolol tartrate (LOPRESSOR) 25 MG tablet, Take 12.5 mg by mouth 2 (two) times daily., Disp: , Rfl:    predniSONE (DELTASONE) 10 MG tablet, Take 1 tablet (10 mg total) by mouth daily with breakfast for 5 days., Disp: 5 tablet, Rfl: 0   Syringe/Needle, Disp, (SYRINGE 3CC/25GX1") 25G X 1" 3 ML MISC, Use once a month with b12, Disp: , Rfl:    TURMERIC PO, Take 1 tablet by mouth daily., Disp: , Rfl:    nitrofurantoin, macrocrystal-monohydrate, (MACROBID) 100 MG capsule, Take 100 mg by mouth 2 (two) times daily. (Patient not taking: Reported on 07/15/2023), Disp: , Rfl:   Past  Medical History: History reviewed. No pertinent past medical history.  Tobacco Use: Social History   Tobacco Use  Smoking Status Never  Smokeless Tobacco Never    Labs: Review Flowsheet       Latest Ref Rng & Units 02/01/2023  Labs for ITP Cardiac and Pulmonary Rehab  Bicarbonate 20.0 - 28.0 mmol/L 18.8  19.2  19.0   TCO2 22 - 32 mmol/L 20  20  20    Acid-base deficit 0.0 - 2.0 mmol/L 4.0  3.0  4.0   O2 Saturation % 82  86  88     Details       Multiple values from one day are sorted in reverse-chronological order         Capillary Blood Glucose: No results found for: "GLUCAP"   Pulmonary Assessment Scores:  Pulmonary Assessment Scores     Row Name 07/15/23 1059         ADL UCSD   ADL Phase Entry     SOB Score total 54       CAT Score   CAT Score 11       mMRC Score   mMRC Score 2             UCSD: Self-administered rating of dyspnea associated with activities of daily living (ADLs) 6-point scale (0 = "not at all" to 5 = "maximal or unable to do because of breathlessness")  Scoring Scores range from 0 to 120.  Minimally important difference is 5 units  CAT: CAT can identify the health impairment of COPD patients and is better correlated with disease progression.  CAT has a scoring range of zero to 40. The CAT score is classified into four groups of low (less than 10), medium (10 - 20), high (21-30) and very high (31-40) based on the impact level of disease on health status. A CAT score over 10 suggests significant symptoms.  A worsening CAT score could be explained by an exacerbation, poor medication adherence, poor inhaler technique, or progression of COPD or comorbid conditions.  CAT MCID is 2 points  mMRC: mMRC (Modified Medical Research Council) Dyspnea Scale is used to assess the degree of baseline functional disability in patients of respiratory disease due to dyspnea. No minimal important difference is established. A decrease in score of 1  point or greater is considered a positive change.   Pulmonary Function Assessment:  Pulmonary Function Assessment - 07/15/23 1045       Breath   Bilateral Breath Sounds Clear    Shortness of Breath Fear of Shortness of Breath;Yes;Limiting activity;Panic with Shortness of Breath             Exercise Target Goals: Exercise Program Goal: Individual exercise prescription set using results from initial 6 min walk test and THRR while considering  patient's activity barriers and safety.   Exercise Prescription Goal: Initial exercise prescription builds to 30-45 minutes a day of aerobic activity, 2-3 days per week.  Home exercise guidelines will be given to patient during program as part of exercise prescription that the participant will acknowledge.  Activity Barriers & Risk Stratification:  Activity Barriers & Cardiac Risk Stratification - 07/15/23 1043       Activity Barriers & Cardiac Risk Stratification   Activity Barriers Deconditioning;Shortness of Breath;Chest Pain/Angina;Muscular Weakness   pt states she has chest pain when she experiences tachycardia   Cardiac Risk Stratification Low             6 Minute Walk:  6 Minute Walk     Row Name 07/15/23 1132         6 Minute Walk   Phase Initial     Distance 1690 feet     Walk Time 6 minutes     # of Rest Breaks 0     MPH 3.2     METS 4.22     RPE 7     Perceived Dyspnea  1     VO2 Peak 14.78     Symptoms No     Resting HR 73 bpm     Resting BP 104/70     Resting Oxygen Saturation  100 %     Exercise Oxygen Saturation  during 6 min walk 98 %     Max Ex. HR 117 bpm     Max Ex. BP 120/68     2 Minute Post BP 106/60       Interval HR   1 Minute HR 95     2 Minute HR 104     3 Minute HR 103     4 Minute HR 110     5 Minute HR 114     6 Minute HR 117     2 Minute Post HR 93     Interval Heart Rate? Yes       Interval Oxygen   Interval Oxygen? Yes     Baseline Oxygen Saturation % 100 %     1  Minute  Oxygen Saturation % 100 %     1 Minute Liters of Oxygen 0 L     2 Minute Oxygen Saturation % 98 %     2 Minute Liters of Oxygen 0 L     3 Minute Oxygen Saturation % 100 %     3 Minute Liters of Oxygen 0 L     4 Minute Oxygen Saturation % 98 %     4 Minute Liters of Oxygen 0 L     5 Minute Oxygen Saturation % 98 %     5 Minute Liters of Oxygen 0 L     6 Minute Oxygen Saturation % 99 %     6 Minute Liters of Oxygen 0 L     2 Minute Post Oxygen Saturation % 100 %     2 Minute Post Liters of Oxygen 0 L              Oxygen Initial Assessment:  Oxygen Initial Assessment - 07/15/23 1045       Home Oxygen   Home Oxygen Device None    Sleep Oxygen Prescription None    Home Exercise Oxygen Prescription None    Home Resting Oxygen Prescription None      Initial 6 min Walk   Oxygen Used None      Program Oxygen Prescription   Program Oxygen Prescription None      Intervention   Short Term Goals To learn and understand importance of monitoring SPO2 with pulse oximeter and demonstrate accurate use of the pulse oximeter.;To learn and understand importance of maintaining oxygen saturations>88%;To learn and demonstrate proper pursed lip breathing techniques or other breathing techniques. ;To learn and demonstrate proper use of respiratory medications    Long  Term Goals Maintenance of O2 saturations>88%;Compliance with respiratory medication;Verbalizes importance of monitoring SPO2 with pulse oximeter and return demonstration;Exhibits proper breathing techniques, such as pursed lip breathing or other method taught during program session;Demonstrates proper use of MDI's             Oxygen Re-Evaluation:   Oxygen Discharge (Final Oxygen Re-Evaluation):   Initial Exercise Prescription:  Initial Exercise Prescription - 07/15/23 1100       Date of Initial Exercise RX and Referring Provider   Date 07/15/23    Referring Provider Hunsucker    Expected Discharge Date 10/10/23       Treadmill   MPH 2.7    Grade 0    Minutes 15      Rower   Level 1    Watts 50    Minutes 15      Prescription Details   Frequency (times per week) 2    Duration Progress to 30 minutes of continuous aerobic without signs/symptoms of physical distress      Intensity   THRR 40-80% of Max Heartrate 79-158    Ratings of Perceived Exertion 11-13    Perceived Dyspnea 0-4      Progression   Progression Continue to progress workloads to maintain intensity without signs/symptoms of physical distress.      Resistance Training   Training Prescription Yes    Weight black bands    Reps 10-15             Perform Capillary Blood Glucose checks as needed.  Exercise Prescription Changes:   Exercise Comments:   Exercise Goals and Review:   Exercise Goals     Row Name 07/15/23 1044  Exercise Goals   Increase Physical Activity Yes       Intervention Provide advice, education, support and counseling about physical activity/exercise needs.;Develop an individualized exercise prescription for aerobic and resistive training based on initial evaluation findings, risk stratification, comorbidities and participant's personal goals.       Expected Outcomes Short Term: Attend rehab on a regular basis to increase amount of physical activity.;Long Term: Add in home exercise to make exercise part of routine and to increase amount of physical activity.;Long Term: Exercising regularly at least 3-5 days a week.       Increase Strength and Stamina Yes       Intervention Provide advice, education, support and counseling about physical activity/exercise needs.;Develop an individualized exercise prescription for aerobic and resistive training based on initial evaluation findings, risk stratification, comorbidities and participant's personal goals.       Expected Outcomes Short Term: Increase workloads from initial exercise prescription for resistance, speed, and METs.;Short Term: Perform  resistance training exercises routinely during rehab and add in resistance training at home;Long Term: Improve cardiorespiratory fitness, muscular endurance and strength as measured by increased METs and functional capacity ( )       Able to understand and use rate of perceived exertion (RPE) scale Yes       Intervention Provide education and explanation on how to use RPE scale       Expected Outcomes Short Term: Able to use RPE daily in rehab to express subjective intensity level;Long Term:  Able to use RPE to guide intensity level when exercising independently       Able to understand and use Dyspnea scale Yes       Intervention Provide education and explanation on how to use Dyspnea scale       Expected Outcomes Short Term: Able to use Dyspnea scale daily in rehab to express subjective sense of shortness of breath during exertion;Long Term: Able to use Dyspnea scale to guide intensity level when exercising independently       Knowledge and understanding of Target Heart Rate Range (THRR) Yes       Intervention Provide education and explanation of THRR including how the numbers were predicted and where they are located for reference       Expected Outcomes Short Term: Able to state/look up THRR;Short Term: Able to use daily as guideline for intensity in rehab;Long Term: Able to use THRR to govern intensity when exercising independently       Understanding of Exercise Prescription Yes       Intervention Provide education, explanation, and written materials on patient's individual exercise prescription       Expected Outcomes Short Term: Able to explain program exercise prescription;Long Term: Able to explain home exercise prescription to exercise independently                Exercise Goals Re-Evaluation :   Discharge Exercise Prescription (Final Exercise Prescription Changes):   Nutrition:  Target Goals: Understanding of nutrition guidelines, daily intake of sodium 1500mg , cholesterol  200mg , calories 30% from fat and 7% or less from saturated fats, daily to have 5 or more servings of fruits and vegetables.  Biometrics:  Pre Biometrics - 07/15/23 1143       Pre Biometrics   Grip Strength 30 kg              Nutrition Therapy Plan and Nutrition Goals:   Nutrition Assessments:  MEDIFICTS Score Key: >=70 Need to make dietary changes  40-70 Heart  Healthy Diet <= 40 Therapeutic Level Cholesterol Diet   Picture Your Plate Scores: <19 Unhealthy dietary pattern with much room for improvement. 41-50 Dietary pattern unlikely to meet recommendations for good health and room for improvement. 51-60 More healthful dietary pattern, with some room for improvement.  >60 Healthy dietary pattern, although there may be some specific behaviors that could be improved.    Nutrition Goals Re-Evaluation:   Nutrition Goals Discharge (Final Nutrition Goals Re-Evaluation):   Psychosocial: Target Goals: Acknowledge presence or absence of significant depression and/or stress, maximize coping skills, provide positive support system. Participant is able to verbalize types and ability to use techniques and skills needed for reducing stress and depression.  Initial Review & Psychosocial Screening:  Initial Psych Review & Screening - 07/15/23 1041       Initial Review   Current issues with Current Anxiety/Panic      Family Dynamics   Good Support System? Yes    Comments Carlethia states she gets anxious when she experiences SOB      Barriers   Psychosocial barriers to participate in program The patient should benefit from training in stress management and relaxation.      Screening Interventions   Interventions Encouraged to exercise             Quality of Life Scores:  Scores of 19 and below usually indicate a poorer quality of life in these areas.  A difference of  2-3 points is a clinically meaningful difference.  A difference of 2-3 points in the total score of  the Quality of Life Index has been associated with significant improvement in overall quality of life, self-image, physical symptoms, and general health in studies assessing change in quality of life.  PHQ-9: Review Flowsheet       07/15/2023  Depression screen PHQ 2/9  Decreased Interest 1  Down, Depressed, Hopeless 0  PHQ - 2 Score 1  Altered sleeping 0  Tired, decreased energy 1  Change in appetite 0  Feeling bad or failure about yourself  0  Trouble concentrating 0  Moving slowly or fidgety/restless 0  Suicidal thoughts 0  PHQ-9 Score 2  Difficult doing work/chores Somewhat difficult    Details           Interpretation of Total Score  Total Score Depression Severity:  1-4 = Minimal depression, 5-9 = Mild depression, 10-14 = Moderate depression, 15-19 = Moderately severe depression, 20-27 = Severe depression   Psychosocial Evaluation and Intervention:  Psychosocial Evaluation - 07/15/23 1042       Psychosocial Evaluation & Interventions   Interventions Encouraged to exercise with the program and follow exercise prescription    Comments Shoshanah states she experiences anxiety when she gets SOB. She denies any other psychosocial barriers or concerns.    Expected Outcomes For [place] to participate in the program free of any psychosocial barriers or concerns.    Continue Psychosocial Services  No Follow up required             Psychosocial Re-Evaluation:   Psychosocial Discharge (Final Psychosocial Re-Evaluation):   Education: Education Goals: Education classes will be provided on a weekly basis, covering required topics. Participant will state understanding/return demonstration of topics presented.  Learning Barriers/Preferences:  Learning Barriers/Preferences - 07/15/23 1042       Learning Barriers/Preferences   Learning Barriers None    Learning Preferences None             Education Topics: Know Your Numbers Group instruction  that is supported  by a PowerPoint presentation. Instructor discusses importance of knowing and understanding resting, exercise, and post-exercise oxygen saturation, heart rate, and blood pressure. Oxygen saturation, heart rate, blood pressure, rating of perceived exertion, and dyspnea are reviewed along with a normal range for these values.    Exercise for the Pulmonary Patient Group instruction that is supported by a PowerPoint presentation. Instructor discusses benefits of exercise, core components of exercise, frequency, duration, and intensity of an exercise routine, importance of utilizing pulse oximetry during exercise, safety while exercising, and options of places to exercise outside of rehab.    MET Level  Group instruction provided by PowerPoint, verbal discussion, and written material to support subject matter. Instructor reviews what METs are and how to increase METs.    Pulmonary Medications Verbally interactive group education provided by instructor with focus on inhaled medications and proper administration.   Anatomy and Physiology of the Respiratory System Group instruction provided by PowerPoint, verbal discussion, and written material to support subject matter. Instructor reviews respiratory cycle and anatomical components of the respiratory system and their functions. Instructor also reviews differences in obstructive and restrictive respiratory diseases with examples of each.    Oxygen Safety Group instruction provided by PowerPoint, verbal discussion, and written material to support subject matter. There is an overview of "What is Oxygen" and "Why do we need it".  Instructor also reviews how to create a safe environment for oxygen use, the importance of using oxygen as prescribed, and the risks of noncompliance. There is a brief discussion on traveling with oxygen and resources the patient may utilize.   Oxygen Use Group instruction provided by PowerPoint, verbal discussion, and written  material to discuss how supplemental oxygen is prescribed and different types of oxygen supply systems. Resources for more information are provided.    Breathing Techniques Group instruction that is supported by demonstration and informational handouts. Instructor discusses the benefits of pursed lip and diaphragmatic breathing and detailed demonstration on how to perform both.     Risk Factor Reduction Group instruction that is supported by a PowerPoint presentation. Instructor discusses the definition of a risk factor, different risk factors for pulmonary disease, and how the heart and lungs work together.   Pulmonary Diseases Group instruction provided by PowerPoint, verbal discussion, and written material to support subject matter. Instructor gives an overview of the different type of pulmonary diseases. There is also a discussion on risk factors and symptoms as well as ways to manage the diseases.   Stress and Energy Conservation Group instruction provided by PowerPoint, verbal discussion, and written material to support subject matter. Instructor gives an overview of stress and the impact it can have on the body. Instructor also reviews ways to reduce stress. There is also a discussion on energy conservation and ways to conserve energy throughout the day.   Warning Signs and Symptoms Group instruction provided by PowerPoint, verbal discussion, and written material to support subject matter. Instructor reviews warning signs and symptoms of stroke, heart attack, cold and flu. Instructor also reviews ways to prevent the spread of infection.   Other Education Group or individual verbal, written, or video instructions that support the educational goals of the pulmonary rehab program.    Knowledge Questionnaire Score:  Knowledge Questionnaire Score - 07/15/23 1101       Knowledge Questionnaire Score   Pre Score 14/18             Core Components/Risk Factors/Patient Goals at  Admission:  Personal Goals  and Risk Factors at Admission - 07/15/23 1042       Core Components/Risk Factors/Patient Goals on Admission   Improve shortness of breath with ADL's Yes    Intervention Provide education, individualized exercise plan and daily activity instruction to help decrease symptoms of SOB with activities of daily living.    Expected Outcomes Short Term: Improve cardiorespiratory fitness to achieve a reduction of symptoms when performing ADLs;Long Term: Be able to perform more ADLs without symptoms or delay the onset of symptoms             Core Components/Risk Factors/Patient Goals Review:    Core Components/Risk Factors/Patient Goals at Discharge (Final Review):    ITP Comments:   Comments: Dr. Mechele Collin is Medical Director for Pulmonary Rehab at [facility].

## 2023-07-15 NOTE — Progress Notes (Signed)
Pulmonary Rehab Orientation Physical Assessment Note  Physical assessment reveals patient is alert and oriented x 4.  Heart rate is normal, breath sounds clear to auscultation, no wheezes, rales, or rhonchi. Pt denies chronic cough. Bowel sounds present x4 quads.  Pt denies abdominal discomfort, nausea, vomiting, diarrhea or constipation. Grip strength equal, strong. Distal pulses +2; no swelling to lower extremities.   Essie Hart, RN, BSN

## 2023-07-23 ENCOUNTER — Encounter (HOSPITAL_COMMUNITY)
Admission: RE | Admit: 2023-07-23 | Discharge: 2023-07-23 | Disposition: A | Discharge status: Home / Self Care | Payer: BC Managed Care – PPO | Source: Ambulatory Visit | Attending: Pulmonary Disease | Admitting: Pulmonary Disease

## 2023-07-23 VITALS — Wt 131.6 lb

## 2023-07-23 DIAGNOSIS — U099 Post covid-19 condition, unspecified: Secondary | ICD-10-CM

## 2023-07-23 DIAGNOSIS — R0609 Other forms of dyspnea: Secondary | ICD-10-CM

## 2023-07-23 NOTE — Progress Notes (Signed)
Daily Session Note  Patient Details  Name: Lisa Lawrence MRN: [id number] Date of Birth: August 22, 2001 Referring Provider:   Doristine Devoid Pulmonary Rehab Walk Test from 07/15/2023 in [facility]  Referring Provider Hunsucker       Encounter Date: 07/23/2023  Check In:  Session Check In - 07/23/23 0825       Check-In   Supervising physician immediately available to respond to emergencies [facility] MD immediately available    Physician(s) Jari Favre, NP    Location [facility]    Staff Present Essie Hart, RN, BSN;Casey Katrinka Blazing, RT;Areanna Gengler BS, ACSM-CEP, Exercise Physiologist    Virtual Visit No    Medication changes reported     No    Fall or balance concerns reported    No    Tobacco Cessation No Change    Warm-up and Cool-down Performed as group-led instruction    Resistance Training Performed Yes    VAD Patient? No    PAD/SET Patient? No      Pain Assessment   Currently in Pain? No/denies    Multiple Pain Sites No             Capillary Blood Glucose: No results found for this or any previous visit (from the past 24 hour(s)).   Exercise Prescription Changes - 07/23/23 0900       Response to Exercise   Blood Pressure (Admit) 106/64    Blood Pressure (Exercise) 122/60    Blood Pressure (Exit) 118/64    Heart Rate (Admit) 87 bpm    Heart Rate (Exercise) 144 bpm    Heart Rate (Exit) 126 bpm   pt has baseline tachy   Oxygen Saturation (Admit) 100 %    Oxygen Saturation (Exercise) 97 %    Oxygen Saturation (Exit) 96 %    Rating of Perceived Exertion (Exercise) 13    Perceived Dyspnea (Exercise) 1    Duration Progress to 30 minutes of  aerobic without signs/symptoms of physical distress    Intensity THRR unchanged      Progression   Progression Continue to progress workloads to maintain intensity without signs/symptoms of physical distress.    Average METs 2.7      Resistance Training    Training Prescription Yes    Weight blue bands    Reps 10-15    Time 10 Minutes      Treadmill   MPH 2.5    Grade 0    Minutes 15      Rower   Level 2    Watts 40    Minutes 15             Social History   Tobacco Use  Smoking Status Never  Smokeless Tobacco Never    Goals Met:  Exercise tolerated well No report of concerns or symptoms today Strength training completed today  Goals Unmet:  Not Applicable  Comments: Service time is from 0813 to [phone].   Dr. Mechele Collin is Medical Director for Pulmonary Rehab at [facility].

## 2023-07-25 ENCOUNTER — Encounter (HOSPITAL_COMMUNITY)
Admission: RE | Admit: 2023-07-25 | Discharge: 2023-07-25 | Disposition: A | Discharge status: Home / Self Care | Payer: BC Managed Care – PPO | Source: Ambulatory Visit | Attending: Pulmonary Disease | Admitting: Pulmonary Disease

## 2023-07-25 DIAGNOSIS — U099 Post covid-19 condition, unspecified: Secondary | ICD-10-CM

## 2023-07-25 DIAGNOSIS — R0609 Other forms of dyspnea: Secondary | ICD-10-CM

## 2023-07-25 NOTE — Progress Notes (Signed)
Daily Session Note  Patient Details  Name: Lisa Lawrence MRN: [id number] Date of Birth: 2000-11-10 Referring Provider:   Doristine Devoid Pulmonary Rehab Walk Test from 07/15/[facility]  Referring Provider Hunsucker       Encounter Date: 07/25/2023  Check In:  Session Check In - 07/25/23 2130       Check-In   Supervising physician immediately available to respond to emergencies [facility] MD immediately available    Physician(s) Jari Favre, NP    Location [facility]    Staff Present Essie Hart, RN, BSN;Casey Katrinka Blazing, RT;Randi [facility] BS, ACSM-CEP, Exercise Physiologist;Samantha Bel[place], [place], Rexene Agent, MS, ACSM-CEP, Exercise Physiologist    Virtual Visit No    Medication changes reported     No    Fall or balance concerns reported    No    Tobacco Cessation No Change    Warm-up and Cool-down Performed as group-led instruction    Resistance Training Performed Yes    VAD Patient? No    PAD/SET Patient? No      Pain Assessment   Currently in Pain? No/denies    Multiple Pain Sites No             Capillary Blood Glucose: No results found for this or any previous visit (from the past 24 hour(s)).    Social History   Tobacco Use  Smoking Status Never  Smokeless Tobacco Never    Goals Met:  Independence with exercise equipment Exercise tolerated well No report of concerns or symptoms today Strength training completed today  Goals Unmet:  Not Applicable  Comments: Service time is from 0810 to 0928  Dr. Mechele Collin is Medical Director for Pulmonary Rehab at [facility]

## 2023-07-30 ENCOUNTER — Encounter (HOSPITAL_COMMUNITY)
Admission: RE | Admit: 2023-07-30 | Discharge: 2023-07-30 | Disposition: A | Discharge status: Home / Self Care | Payer: BC Managed Care – PPO | Source: Ambulatory Visit | Attending: Pulmonary Disease | Admitting: Pulmonary Disease

## 2023-07-30 DIAGNOSIS — U099 Post covid-19 condition, unspecified: Secondary | ICD-10-CM

## 2023-07-30 DIAGNOSIS — R0609 Other forms of dyspnea: Secondary | ICD-10-CM

## 2023-07-30 NOTE — Progress Notes (Signed)
Daily Session Note  Patient Details  Name: Ronae Raybould MRN: [id number] Date of Birth: 23-Aug-2001 Referring Provider:   Doristine Devoid Pulmonary Rehab Walk Test from 07/15/2023 in [facility]  Referring Provider Hunsucker       Encounter Date: 07/30/2023  Check In:  Session Check In - 07/30/23 4098       Check-In   Supervising physician immediately available to respond to emergencies [facility] MD immediately available    Physician(s) Edd Fabian, NP    [facility]    Staff Present Essie Hart, RN, BSN;Casey Katrinka Blazing, RT;Randi [facility] BS, ACSM-CEP, Exercise Physiologist;Ellyanna Holton Earlene Plater, MS, ACSM-CEP, Exercise Physiologist    Virtual Visit No    Medication changes reported     No    Fall or balance concerns reported    No    Tobacco Cessation No Change    Warm-up and Cool-down Performed as group-led instruction    Resistance Training Performed Yes    VAD Patient? No    PAD/SET Patient? No      Pain Assessment   Currently in Pain? No/denies    Multiple Pain Sites No             Capillary Blood Glucose: No results found for this or any previous visit (from the past 24 hour(s)).    Social History   Tobacco Use  Smoking Status Never  Smokeless Tobacco Never    Goals Met:  Proper associated with RPD/PD & O2 Sat Independence with exercise equipment Exercise tolerated well No report of concerns or symptoms today Strength training completed today  Goals Unmet:  Not Applicable  Comments: Service time is from 0812 to 0931.

## 2023-08-01 ENCOUNTER — Encounter (HOSPITAL_COMMUNITY)
Admission: RE | Admit: 2023-08-01 | Discharge: 2023-08-01 | Disposition: A | Discharge status: Home / Self Care | Payer: BC Managed Care – PPO | Source: Ambulatory Visit | Attending: Pulmonary Disease | Admitting: Pulmonary Disease

## 2023-08-01 DIAGNOSIS — U099 Post covid-19 condition, unspecified: Secondary | ICD-10-CM | POA: Diagnosis not present

## 2023-08-01 DIAGNOSIS — R0609 Other forms of dyspnea: Secondary | ICD-10-CM

## 2023-08-01 NOTE — Progress Notes (Signed)
Daily Session Note  Patient Details  Name: Lisa Lawrence MRN: [id number] Date of Birth: June 30, 2001 Referring Provider:   Doristine Devoid Pulmonary Rehab Walk Test from 07/15/2023 in [facility]  Referring Provider Hunsucker       Encounter Date: 08/01/2023  Check In:  Session Check In - 08/01/23 4098       Check-In   Supervising physician immediately available to respond to emergencies [facility] MD immediately available    Physician(s) Eligha Bridegroom, NP    [facility]    Staff Present Essie Hart, RN, BSN;Vanassa Penniman Katrinka Blazing, RT;Randi [facility] BS, ACSM-CEP, Exercise Physiologist;Kaylee Earlene Plater, MS, ACSM-CEP, Exercise Physiologist    Virtual Visit No    Medication changes reported     No    Fall or balance concerns reported    No    Tobacco Cessation No Change    Warm-up and Cool-down Performed as group-led instruction    Resistance Training Performed Yes    VAD Patient? No    PAD/SET Patient? No      Pain Assessment   Currently in Pain? No/denies    Multiple Pain Sites No             Capillary Blood Glucose: No results found for this or any previous visit (from the past 24 hour(s)).    Social History   Tobacco Use  Smoking Status Never  Smokeless Tobacco Never    Goals Met:  Proper associated with RPD/PD & O2 Sat Independence with exercise equipment Exercise tolerated well No report of concerns or symptoms today Strength training completed today  Goals Unmet:  Not Applicable  Comments: Service time is from 0815 to 0941.  Dr. Mechele Collin is Medical Director for Pulmonary Rehab at [facility].

## 2023-08-05 ENCOUNTER — Telehealth (HOSPITAL_COMMUNITY): Payer: Self-pay

## 2023-08-05 NOTE — Telephone Encounter (Signed)
-----   Message from Lesia Sago [facility] sent at 08/05/2023  8:05 AM EDT ----- Regarding: RE: Jogging approval Yes- sounds good to me to advance. ----- Message ----- From: Joya San Sent: 08/05/2023   7:52 AM EDT To: Karren Burly, MD Subject: Jogging approval                               Dr. Judeth Horn,  Argelia has been doing very well in Pulmonary Rehab. She has not had chest pain or palpitations with exercise. Her EKG has been sinus tachycardia with exercise. I want to progress her to intervals of jogging and walking. Please advise.  Thanks [employer]

## 2023-08-06 ENCOUNTER — Encounter (HOSPITAL_COMMUNITY)
Admission: RE | Admit: 2023-08-06 | Discharge: 2023-08-06 | Disposition: A | Discharge status: Home / Self Care | Payer: BC Managed Care – PPO | Source: Ambulatory Visit | Attending: Pulmonary Disease | Admitting: Pulmonary Disease

## 2023-08-06 VITALS — Wt 132.5 lb

## 2023-08-06 DIAGNOSIS — U099 Post covid-19 condition, unspecified: Secondary | ICD-10-CM | POA: Diagnosis not present

## 2023-08-06 DIAGNOSIS — R0609 Other forms of dyspnea: Secondary | ICD-10-CM

## 2023-08-06 NOTE — Progress Notes (Signed)
Daily Session Note  Patient Details  Name: Lisa Lawrence MRN: [id number] Date of Birth: 01/15/2001 Referring Provider:   Doristine Devoid Pulmonary Rehab Walk Test from 07/15/2023 in [facility]  Referring Provider Hunsucker       Encounter Date: 08/06/2023  Check In:  Session Check In - 08/06/23 0843       Check-In   Supervising physician immediately available to respond to emergencies [facility] MD immediately available    Physician(s) Neila Gear, NP    [facility]    Staff Present Essie Hart, RN, BSN;Casey Charlean Sanfilippo, MS, ACSM-CEP, Exercise Physiologist    Virtual Visit No    Medication changes reported     No    Fall or balance concerns reported    No    Tobacco Cessation No Change    Warm-up and Cool-down Performed as group-led instruction    Resistance Training Performed Yes    VAD Patient? No    PAD/SET Patient? No      Pain Assessment   Currently in Pain? No/denies    Multiple Pain Sites No             Capillary Blood Glucose: No results found for this or any previous visit (from the past 24 hour(s)).   Exercise Prescription Changes - 08/06/23 0900       Response to Exercise   Blood Pressure (Admit) 96/58    Blood Pressure (Exercise) 130/68    Blood Pressure (Exit) 96/60    Heart Rate (Admit) 85 bpm    Heart Rate (Exercise) 141 bpm    Heart Rate (Exit) 114 bpm    Oxygen Saturation (Admit) 100 %    Oxygen Saturation (Exercise) 99 %    Oxygen Saturation (Exit) 96 %    Rating of Perceived Exertion (Exercise) 9    Perceived Dyspnea (Exercise) 1    Duration Continue with 30 min of aerobic exercise without signs/symptoms of physical distress.    Intensity THRR unchanged      Progression   Progression Continue to progress workloads to maintain intensity without signs/symptoms of physical distress.      Resistance Training   Training Prescription Yes     Weight black bands    Reps 10-15    Time 10 Minutes      Treadmill   MPH 3.5    Grade 0    Minutes 15    METs 3.9      Bike   Level 2    Minutes 15    METs 4             Social History   Tobacco Use  Smoking Status Never  Smokeless Tobacco Never    Goals Met:  Proper associated with RPD/PD & O2 Sat Independence with exercise equipment Exercise tolerated well No report of concerns or symptoms today Strength training completed today  Goals Unmet:  Not Applicable  Comments: Service time is from 0812 to 0925.   Dr. Mechele Collin is Medical Director for Pulmonary Rehab at [facility].

## 2023-08-07 NOTE — Progress Notes (Signed)
Pulmonary Individual Treatment Plan  Patient Details  Name: Lisa Lawrence MRN: [id number] Date of Birth: August 21, 2001 Referring Provider:   Doristine Devoid Pulmonary Rehab Walk Test from 07/15/2023 in [facility]  Referring Provider Hunsucker       Initial Encounter Date:  Flowsheet Row Pulmonary Rehab Walk Test from 07/15/2023 in [facility]  Date 07/15/23       Visit Diagnosis: DOE (dyspnea on exertion)  Post covid-19 condition, unspecified  Patient's Home Medications on Admission:   Current Outpatient Medications:    albuterol (VENTOLIN HFA) 108 (90 Base) MCG/ACT inhaler, Inhale 1-2 puffs into the lungs every 6 (six) hours as needed for wheezing or shortness of breath., Disp: 18 g, Rfl: 6   Ascorbic Acid (VITAMIN C PO), Take 1 tablet by mouth daily. Elderberry, Disp: , Rfl:    Budeson-Glycopyrrol-Formoterol (BREZTRI AEROSPHERE) 160-9-4.8 MCG/ACT AERO, Inhale 2 puffs into the lungs in the morning and at bedtime., Disp: 1 each, Rfl: 11   Cyanocobalamin (VITAMIN B-12 IJ), Inject as directed every 14 (fourteen) days., Disp: , Rfl:    ferrous sulfate 325 (65 FE) MG tablet, Take 325 mg by mouth daily with breakfast., Disp: , Rfl:    MAGNESIUM PO, Take 1 tablet by mouth daily., Disp: , Rfl:    metoprolol tartrate (LOPRESSOR) 25 MG tablet, Take 12.5 mg by mouth 2 (two) times daily., Disp: , Rfl:    nitrofurantoin, macrocrystal-monohydrate, (MACROBID) 100 MG capsule, Take 100 mg by mouth 2 (two) times daily. (Patient not taking: Reported on 07/15/2023), Disp: , Rfl:    Syringe/Needle, Disp, (SYRINGE 3CC/25GX1") 25G X 1" 3 ML MISC, Use once a month with b12, Disp: , Rfl:    TURMERIC PO, Take 1 tablet by mouth daily., Disp: , Rfl:   Past Medical History: No past medical history on file.  Tobacco Use: Social History   Tobacco Use  Smoking Status Never  Smokeless  Tobacco Never    Labs: Review Flowsheet       Latest Ref Rng & Units 02/01/2023  Labs for ITP Cardiac and Pulmonary Rehab  Bicarbonate 20.0 - 28.0 mmol/L 18.8  19.2  19.0   TCO2 22 - 32 mmol/L 20  20  20    Acid-base deficit 0.0 - 2.0 mmol/L 4.0  3.0  4.0   O2 Saturation % 82  86  88     Details       Multiple values from one day are sorted in reverse-chronological order         Capillary Blood Glucose: No results found for: "GLUCAP"   Pulmonary Assessment Scores:  Pulmonary Assessment Scores     Row Name 07/15/23 1059         ADL UCSD   ADL Phase Entry     SOB Score total 54       CAT Score   CAT Score 11       mMRC Score   mMRC Score 2             UCSD: Self-administered rating of dyspnea associated with activities of daily living (ADLs) 6-point scale (0 = "not at all" to 5 = "maximal or unable to do because of breathlessness")  Scoring Scores range from 0 to 120.  Minimally important difference is 5 units  CAT: CAT can identify the health impairment of COPD patients and is better correlated with disease progression.  CAT  has a scoring range of zero to 40. The CAT score is classified into four groups of low (less than 10), medium (10 - 20), high (21-30) and very high (31-40) based on the impact level of disease on health status. A CAT score over 10 suggests significant symptoms.  A worsening CAT score could be explained by an exacerbation, poor medication adherence, poor inhaler technique, or progression of COPD or comorbid conditions.  CAT MCID is 2 points  mMRC: mMRC (Modified Medical Research Council) Dyspnea Scale is used to assess the degree of baseline functional disability in patients of respiratory disease due to dyspnea. No minimal important difference is established. A decrease in score of 1 point or greater is considered a positive change.   Pulmonary Function Assessment:  Pulmonary Function Assessment - 07/15/23 1045       Breath    Bilateral Breath Sounds Clear    Shortness of Breath Fear of Shortness of Breath;Yes;Limiting activity;Panic with Shortness of Breath             Exercise Target Goals: Exercise Program Goal: Individual exercise prescription set using results from initial 6 min walk test and THRR while considering  patient's activity barriers and safety.   Exercise Prescription Goal: Initial exercise prescription builds to 30-45 minutes a day of aerobic activity, 2-3 days per week.  Home exercise guidelines will be given to patient during program as part of exercise prescription that the participant will acknowledge.  Activity Barriers & Risk Stratification:  Activity Barriers & Cardiac Risk Stratification - 07/15/23 1043       Activity Barriers & Cardiac Risk Stratification   Activity Barriers Deconditioning;Shortness of Breath;Chest Pain/Angina;Muscular Weakness   pt states she has chest pain when she experiences tachycardia   Cardiac Risk Stratification Low             6 Minute Walk:  6 Minute Walk     Row Name 07/15/23 1132         6 Minute Walk   Phase Initial     Distance 1690 feet     Walk Time 6 minutes     # of Rest Breaks 0     MPH 3.2     METS 4.22     RPE 7     Perceived Dyspnea  1     VO2 Peak 14.78     Symptoms No     Resting HR 73 bpm     Resting BP 104/70     Resting Oxygen Saturation  100 %     Exercise Oxygen Saturation  during 6 min walk 98 %     Max Ex. HR 117 bpm     Max Ex. BP 120/68     2 Minute Post BP 106/60       Interval HR   1 Minute HR 95     2 Minute HR 104     3 Minute HR 103     4 Minute HR 110     5 Minute HR 114     6 Minute HR 117     2 Minute Post HR 93     Interval Heart Rate? Yes       Interval Oxygen   Interval Oxygen? Yes     Baseline Oxygen Saturation % 100 %     1 Minute Oxygen Saturation % 100 %     1 Minute Liters of Oxygen 0 L     2 Minute Oxygen Saturation %  98 %     2 Minute Liters of Oxygen 0 L     3 Minute Oxygen  Saturation % 100 %     3 Minute Liters of Oxygen 0 L     4 Minute Oxygen Saturation % 98 %     4 Minute Liters of Oxygen 0 L     5 Minute Oxygen Saturation % 98 %     5 Minute Liters of Oxygen 0 L     6 Minute Oxygen Saturation % 99 %     6 Minute Liters of Oxygen 0 L     2 Minute Post Oxygen Saturation % 100 %     2 Minute Post Liters of Oxygen 0 L              Oxygen Initial Assessment:  Oxygen Initial Assessment - 07/15/23 1045       Home Oxygen   Home Oxygen Device None    Sleep Oxygen Prescription None    Home Exercise Oxygen Prescription None    Home Resting Oxygen Prescription None      Initial 6 min Walk   Oxygen Used None      Program Oxygen Prescription   Program Oxygen Prescription None      Intervention   Short Term Goals To learn and understand importance of monitoring SPO2 with pulse oximeter and demonstrate accurate use of the pulse oximeter.;To learn and understand importance of maintaining oxygen saturations>88%;To learn and demonstrate proper pursed lip breathing techniques or other breathing techniques. ;To learn and demonstrate proper use of respiratory medications    Long  Term Goals Maintenance of O2 saturations>88%;Compliance with respiratory medication;Verbalizes importance of monitoring SPO2 with pulse oximeter and return demonstration;Exhibits proper breathing techniques, such as pursed lip breathing or other method taught during program session;Demonstrates proper use of MDI's             Oxygen Re-Evaluation:  Oxygen Re-Evaluation     Row Name 08/01/23 1621             Program Oxygen Prescription   Program Oxygen Prescription None         Home Oxygen   Home Oxygen Device None       Sleep Oxygen Prescription None       Home Exercise Oxygen Prescription None       Home Resting Oxygen Prescription None         Goals/Expected Outcomes   Short Term Goals To learn and understand importance of monitoring SPO2 with pulse oximeter and  demonstrate accurate use of the pulse oximeter.;To learn and understand importance of maintaining oxygen saturations>88%;To learn and demonstrate proper pursed lip breathing techniques or other breathing techniques. ;To learn and demonstrate proper use of respiratory medications       Long  Term Goals Maintenance of O2 saturations>88%;Compliance with respiratory medication;Verbalizes importance of monitoring SPO2 with pulse oximeter and return demonstration;Exhibits proper breathing techniques, such as pursed lip breathing or other method taught during program session;Demonstrates proper use of MDI's       Goals/Expected Outcomes Compliance and understanding of oxygen saturation monitoring and breathing techniques to decrease shortness of breath                Oxygen Discharge (Final Oxygen Re-Evaluation):  Oxygen Re-Evaluation - 08/01/23 1621       Program Oxygen Prescription   Program Oxygen Prescription None      Home Oxygen   Home Oxygen Device None  Sleep Oxygen Prescription None    Home Exercise Oxygen Prescription None    Home Resting Oxygen Prescription None      Goals/Expected Outcomes   Short Term Goals To learn and understand importance of monitoring SPO2 with pulse oximeter and demonstrate accurate use of the pulse oximeter.;To learn and understand importance of maintaining oxygen saturations>88%;To learn and demonstrate proper pursed lip breathing techniques or other breathing techniques. ;To learn and demonstrate proper use of respiratory medications    Long  Term Goals Maintenance of O2 saturations>88%;Compliance with respiratory medication;Verbalizes importance of monitoring SPO2 with pulse oximeter and return demonstration;Exhibits proper breathing techniques, such as pursed lip breathing or other method taught during program session;Demonstrates proper use of MDI's    Goals/Expected Outcomes Compliance and understanding of oxygen saturation monitoring and breathing  techniques to decrease shortness of breath             Initial Exercise Prescription:  Initial Exercise Prescription - 07/15/23 1100       Date of Initial Exercise RX and Referring Provider   Date 07/15/23    Referring Provider Hunsucker    Expected Discharge Date 10/10/23      Treadmill   MPH 2.7    Grade 0    Minutes 15      Rower   Level 1    Watts 50    Minutes 15      Prescription Details   Frequency (times per week) 2    Duration Progress to 30 minutes of continuous aerobic without signs/symptoms of physical distress      Intensity   THRR 40-80% of Max Heartrate 79-158    Ratings of Perceived Exertion 11-13    Perceived Dyspnea 0-4      Progression   Progression Continue to progress workloads to maintain intensity without signs/symptoms of physical distress.      Resistance Training   Training Prescription Yes    Weight black bands    Reps 10-15             Perform Capillary Blood Glucose checks as needed.  Exercise Prescription Changes:   Exercise Prescription Changes     Row Name 07/23/23 0900 08/06/23 0900           Response to Exercise   Blood Pressure (Admit) 106/64 96/58      Blood Pressure (Exercise) 122/60 130/68      Blood Pressure (Exit) 118/64 96/60      Heart Rate (Admit) 87 bpm 85 bpm      Heart Rate (Exercise) 144 bpm 141 bpm      Heart Rate (Exit) 126 bpm  pt has baseline tachy 114 bpm      Oxygen Saturation (Admit) 100 % 100 %      Oxygen Saturation (Exercise) 97 % 99 %      Oxygen Saturation (Exit) 96 % 96 %      Rating of Perceived Exertion (Exercise) 13 9      Perceived Dyspnea (Exercise) 1 1      Duration Progress to 30 minutes of  aerobic without signs/symptoms of physical distress Continue with 30 min of aerobic exercise without signs/symptoms of physical distress.      Intensity THRR unchanged THRR unchanged        Progression   Progression Continue to progress workloads to maintain intensity without  signs/symptoms of physical distress. Continue to progress workloads to maintain intensity without signs/symptoms of physical distress.      Average METs 2.7 --  Resistance Training   Training Prescription Yes Yes      Weight blue bands black bands      Reps 10-15 10-15      Time 10 Minutes 10 Minutes        Treadmill   MPH 2.5 3.5      Grade 0 0      Minutes 15 15      METs -- 3.9        Bike   Level -- 2      Minutes -- 15      METs -- 4        Rower   Level 2 --      Watts 40 --      Minutes 15 --               Exercise Comments:   Exercise Comments     Row Name 07/23/23 0947           Exercise Comments Pt completed her first day of group exercise. She exercised on the rower for 15 min, level 2, watts average 40. She then walked on the treadmill for 15 min 2.7 mph, 0 incline, METs 2.7. tolerated well. Performed warm up and cool down without limitations. Pt did check out with an elevated HR, which is typical for her. Discussed METs.                Exercise Goals and Review:   Exercise Goals     Row Name 07/15/23 1044             Exercise Goals   Increase Physical Activity Yes       Intervention Provide advice, education, support and counseling about physical activity/exercise needs.;Develop an individualized exercise prescription for aerobic and resistive training based on initial evaluation findings, risk stratification, comorbidities and participant's personal goals.       Expected Outcomes Short Term: Attend rehab on a regular basis to increase amount of physical activity.;Long Term: Add in home exercise to make exercise part of routine and to increase amount of physical activity.;Long Term: Exercising regularly at least 3-5 days a week.       Increase Strength and Stamina Yes       Intervention Provide advice, education, support and counseling about physical activity/exercise needs.;Develop an individualized exercise prescription for aerobic  and resistive training based on initial evaluation findings, risk stratification, comorbidities and participant's personal goals.       Expected Outcomes Short Term: Increase workloads from initial exercise prescription for resistance, speed, and METs.;Short Term: Perform resistance training exercises routinely during rehab and add in resistance training at home;Long Term: Improve cardiorespiratory fitness, muscular endurance and strength as measured by increased METs and functional capacity ( )       Able to understand and use rate of perceived exertion (RPE) scale Yes       Intervention Provide education and explanation on how to use RPE scale       Expected Outcomes Short Term: Able to use RPE daily in rehab to express subjective intensity level;Long Term:  Able to use RPE to guide intensity level when exercising independently       Able to understand and use Dyspnea scale Yes       Intervention Provide education and explanation on how to use Dyspnea scale       Expected Outcomes Short Term: Able to use Dyspnea scale daily in rehab to express subjective sense of shortness of breath  during exertion;Long Term: Able to use Dyspnea scale to guide intensity level when exercising independently       Knowledge and understanding of Target Heart Rate Range (THRR) Yes       Intervention Provide education and explanation of THRR including how the numbers were predicted and where they are located for reference       Expected Outcomes Short Term: Able to state/look up THRR;Short Term: Able to use daily as guideline for intensity in rehab;Long Term: Able to use THRR to govern intensity when exercising independently       Understanding of Exercise Prescription Yes       Intervention Provide education, explanation, and written materials on patient's individual exercise prescription       Expected Outcomes Short Term: Able to explain program exercise prescription;Long Term: Able to explain home exercise  prescription to exercise independently                Exercise Goals Re-Evaluation :  Exercise Goals Re-Evaluation     Row Name 08/01/23 1616             Exercise Goal Re-Evaluation   Exercise Goals Review Increase Physical Activity;Able to understand and use Dyspnea scale;Understanding of Exercise Prescription;Increase Strength and Stamina;Knowledge and understanding of Target Heart Rate Range (THRR);Able to understand and use rate of perceived exertion (RPE) scale       Comments Lisa Lawrence has completed 4 exercise sessions. She exercises for 15 min on the rower and treadmill. Aqua averages 53 watts at level 3 on the rower and 4.4 METs at 3.3 mph and 2% incline. She performs the warmup and cooldown standing without limitations. Lisa Lawrence has increased her workload for both exercise modes as she tolerates progressions well. Watts and METs have also increased. I am hoping to progressing her to interval running on the treadmill after a couple of week. Will continue to monitor and progress as able.       Expected Outcomes Through exercise at rehab and home, the patient will decrease shortness of breath with daily activities and feel confident in carrying out an exercise regimen at home                Discharge Exercise Prescription (Final Exercise Prescription Changes):  Exercise Prescription Changes - 08/06/23 0900       Response to Exercise   Blood Pressure (Admit) 96/58    Blood Pressure (Exercise) 130/68    Blood Pressure (Exit) 96/60    Heart Rate (Admit) 85 bpm    Heart Rate (Exercise) 141 bpm    Heart Rate (Exit) 114 bpm    Oxygen Saturation (Admit) 100 %    Oxygen Saturation (Exercise) 99 %    Oxygen Saturation (Exit) 96 %    Rating of Perceived Exertion (Exercise) 9    Perceived Dyspnea (Exercise) 1    Duration Continue with 30 min of aerobic exercise without signs/symptoms of physical distress.    Intensity THRR unchanged      Progression   Progression Continue to  progress workloads to maintain intensity without signs/symptoms of physical distress.      Resistance Training   Training Prescription Yes    Weight black bands    Reps 10-15    Time 10 Minutes      Treadmill   MPH 3.5    Grade 0    Minutes 15    METs 3.9      Bike   Level 2    Minutes 15  METs 4             Nutrition:  Target Goals: Understanding of nutrition guidelines, daily intake of sodium 1500mg , cholesterol 200mg , calories 30% from fat and 7% or less from saturated fats, daily to have 5 or more servings of fruits and vegetables.  Biometrics:  Pre Biometrics - 07/15/23 1143       Pre Biometrics   Grip Strength 30 kg              Nutrition Therapy Plan and Nutrition Goals:  Nutrition Therapy & Goals - 07/25/23 0905       Nutrition Therapy   Diet General Healthy Diet      Personal Nutrition Goals   Nutrition Goal Patient to improve diet quality by using the plate method as a guide for meal planning to include lean protein/plant protein, fruits, vegetables, whole grains, nonfat dairy as part of a well-balanced diet.    Personal Goal #2 Patient to monitor sodium and fluid intake regularly.    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 She has been worked up for POTS but did not meet diagnosis criteria. Lisa Lawrence denies nutrition concerns at this time. She eats three meals daily and does enjoy a wide variety of foods. She reports getting 50-64oz of fluids daily. She does not drink caffeine. She continues PO iron and bi-monthly B12 injections. Patient will benefit from participation in pulmonary rehab for nutrition, exercise, and lifestyle modification.      Intervention Plan   Intervention Prescribe, educate and counsel regarding individualized specific dietary modifications aiming towards targeted core components such as weight, hypertension, lipid management, diabetes, heart failure and other comorbidities.;Nutrition handout(s)  given to patient.    Expected Outcomes Short Term Goal: Understand basic principles of dietary content, such as calories, fat, sodium, cholesterol and nutrients.;Long Term Goal: Adherence to prescribed nutrition plan.             Nutrition Assessments:  Nutrition Assessments - 07/25/23 1014       Rate Your Plate Scores   Pre Score 56            MEDIFICTS Score Key: >=70 Need to make dietary changes  40-70 Heart Healthy Diet <= 40 Therapeutic Level Cholesterol Diet  Flowsheet Row PULMONARY REHAB CHRONIC OBSTRUCTIVE PULMONARY DISEASE from 07/25/2023 in [facility]  Picture Your Plate Total Score on Admission 56      Picture Your Plate Scores: <96 Unhealthy dietary pattern with much room for improvement. 41-50 Dietary pattern unlikely to meet recommendations for good health and room for improvement. 51-60 More healthful dietary pattern, with some room for improvement.  >60 Healthy dietary pattern, although there may be some specific behaviors that could be improved.    Nutrition Goals Re-Evaluation:  Nutrition Goals Re-Evaluation     Row Name 07/25/23 0905             Goals   Current Weight 130 lb 8.2 oz (59.2 kg)       Comment labs WNL       Expected Outcome Patient with medical history of long covid-19, DOE, tachycardia. She has been worked up for POTS but did not meet diagnosis criteria. Rai denies nutrition concerns at this time. She eats three meals daily and does enjoy a wide variety of foods. She reports getting 50-64oz of fluids daily. She does not drink caffeine. She continues PO iron and bi-monthly B12 injections. Patient  will benefit from participation in pulmonary rehab for nutrition, exercise, and lifestyle modification.                Nutrition Goals Discharge (Final Nutrition Goals Re-Evaluation):  Nutrition Goals Re-Evaluation - 07/25/23 0905       Goals   Current Weight 130 lb  8.2 oz (59.2 kg)    Comment labs WNL    Expected Outcome Patient with medical history of long covid-19, DOE, tachycardia. She has been worked up for POTS but did not meet diagnosis criteria. Francella denies nutrition concerns at this time. She eats three meals daily and does enjoy a wide variety of foods. She reports getting 50-64oz of fluids daily. She does not drink caffeine. She continues PO iron and bi-monthly B12 injections. Patient will benefit from participation in pulmonary rehab for nutrition, exercise, and lifestyle modification.             Psychosocial: Target Goals: Acknowledge presence or absence of significant depression and/or stress, maximize coping skills, provide positive support system. Participant is able to verbalize types and ability to use techniques and skills needed for reducing stress and depression.  Initial Review & Psychosocial Screening:  Initial Psych Review & Screening - 07/15/23 1041       Initial Review   Current issues with Current Anxiety/Panic      Family Dynamics   Good Support System? Yes    Comments Lisa Lawrence states she gets anxious when she experiences SOB      Barriers   Psychosocial barriers to participate in program The patient should benefit from training in stress management and relaxation.      Screening Interventions   Interventions Encouraged to exercise             Quality of Life Scores:  Scores of 19 and below usually indicate a poorer quality of life in these areas.  A difference of  2-3 points is a clinically meaningful difference.  A difference of 2-3 points in the total score of the Quality of Life Index has been associated with significant improvement in overall quality of life, self-image, physical symptoms, and general health in studies assessing change in quality of life.  PHQ-9: Review Flowsheet       07/15/2023  Depression screen PHQ 2/9  Decreased Interest 1  Down, Depressed, Hopeless 0  PHQ - 2 Score 1   Altered sleeping 0  Tired, decreased energy 1  Change in appetite 0  Feeling bad or failure about yourself  0  Trouble concentrating 0  Moving slowly or fidgety/restless 0  Suicidal thoughts 0  PHQ-9 Score 2  Difficult doing work/chores Somewhat difficult    Details           Interpretation of Total Score  Total Score Depression Severity:  1-4 = Minimal depression, 5-9 = Mild depression, 10-14 = Moderate depression, 15-19 = Moderately severe depression, 20-27 = Severe depression   Psychosocial Evaluation and Intervention:  Psychosocial Evaluation - 07/15/23 1042       Psychosocial Evaluation & Interventions   Interventions Encouraged to exercise with the program and follow exercise prescription    Comments Annaly states she experiences anxiety when she gets SOB. She denies any other psychosocial barriers or concerns.    Expected Outcomes For Lisa Lawrence to participate in the program free of any psychosocial barriers or concerns.    Continue Psychosocial Services  No Follow up required             Psychosocial Re-Evaluation:  Psychosocial Re-Evaluation     Row Name 07/29/23 1038             Psychosocial Re-Evaluation   Current issues with Current Anxiety/Panic       Comments Lisa Lawrence has attended 2 sessions so far. She denies any new psychosocial barriers or concerns at this time.       Expected Outcomes For Lisa Lawrence to partcipate in [place] free of any psychosocial barriers or concerns.       Interventions Encouraged to attend Pulmonary Rehabilitation for the exercise       Continue Psychosocial Services  No Follow up required                Psychosocial Discharge (Final Psychosocial Re-Evaluation):  Psychosocial Re-Evaluation - 07/29/23 1038       Psychosocial Re-Evaluation   Current issues with Current Anxiety/Panic    Comments Lisa Lawrence has attended 2 sessions so far. She denies any new psychosocial barriers or concerns at this time.    Expected Outcomes For  Lisa Lawrence to partcipate in [place] free of any psychosocial barriers or concerns.    Interventions Encouraged to attend Pulmonary Rehabilitation for the exercise    Continue Psychosocial Services  No Follow up required             Education: Education Goals: Education classes will be provided on a weekly basis, covering required topics. Participant will state understanding/return demonstration of topics presented.  Learning Barriers/Preferences:  Learning Barriers/Preferences - 07/15/23 1042       Learning Barriers/Preferences   Learning Barriers None    Learning Preferences None             Education Topics: Know Your Numbers Group instruction that is supported by a PowerPoint presentation. Instructor discusses importance of knowing and understanding resting, exercise, and post-exercise oxygen saturation, heart rate, and blood pressure. Oxygen saturation, heart rate, blood pressure, rating of perceived exertion, and dyspnea are reviewed along with a normal range for these values.    Exercise for the Pulmonary Patient Group instruction that is supported by a PowerPoint presentation. Instructor discusses benefits of exercise, core components of exercise, frequency, duration, and intensity of an exercise routine, importance of utilizing pulse oximetry during exercise, safety while exercising, and options of places to exercise outside of rehab.    MET Level  Group instruction provided by PowerPoint, verbal discussion, and written material to support subject matter. Instructor reviews what METs are and how to increase METs.    Pulmonary Medications Verbally interactive group education provided by instructor with focus on inhaled medications and proper administration.   Anatomy and Physiology of the Respiratory System Group instruction provided by PowerPoint, verbal discussion, and written material to support subject matter. Instructor reviews respiratory cycle and anatomical  components of the respiratory system and their functions. Instructor also reviews differences in obstructive and restrictive respiratory diseases with examples of each.    Oxygen Safety Group instruction provided by PowerPoint, verbal discussion, and written material to support subject matter. There is an overview of "What is Oxygen" and "Why do we need it".  Instructor also reviews how to create a safe environment for oxygen use, the importance of using oxygen as prescribed, and the risks of noncompliance. There is a brief discussion on traveling with oxygen and resources the patient may utilize.   Oxygen Use Group instruction provided by PowerPoint, verbal discussion, and written material to discuss how supplemental oxygen is prescribed and different types of oxygen supply systems. Resources for more information  are provided.  Flowsheet Row PULMONARY REHAB CHRONIC OBSTRUCTIVE PULMONARY DISEASE from 07/25/2023 in [facility] for Heart, Vascular, & Lung Health  Date 07/25/23  Educator RT  Instruction Review Code 1- Verbalizes Understanding       Breathing Techniques Group instruction that is supported by demonstration and informational handouts. Instructor discusses the benefits of pursed lip and diaphragmatic breathing and detailed demonstration on how to perform both.  Flowsheet Row PULMONARY REHAB CHRONIC OBSTRUCTIVE PULMONARY DISEASE from 08/01/2023 in [facility] for Heart, Vascular, & Lung Health  Date 08/01/23  Educator RN  Instruction Review Code 1- Verbalizes Understanding        Risk Factor Reduction Group instruction that is supported by a PowerPoint presentation. Instructor discusses the definition of a risk factor, different risk factors for pulmonary disease, and how the heart and lungs work together.   Pulmonary Diseases Group instruction provided by PowerPoint, verbal discussion, and written material to support subject  matter. Instructor gives an overview of the different type of pulmonary diseases. There is also a discussion on risk factors and symptoms as well as ways to manage the diseases.   Stress and Energy Conservation Group instruction provided by PowerPoint, verbal discussion, and written material to support subject matter. Instructor gives an overview of stress and the impact it can have on the body. Instructor also reviews ways to reduce stress. There is also a discussion on energy conservation and ways to conserve energy throughout the day.   Warning Signs and Symptoms Group instruction provided by PowerPoint, verbal discussion, and written material to support subject matter. Instructor reviews warning signs and symptoms of stroke, heart attack, cold and flu. Instructor also reviews ways to prevent the spread of infection.   Other Education Group or individual verbal, written, or video instructions that support the educational goals of the pulmonary rehab program.    Knowledge Questionnaire Score:  Knowledge Questionnaire Score - 07/15/23 1101       Knowledge Questionnaire Score   Pre Score 14/18             Core Components/Risk Factors/Patient Goals at Admission:  Personal Goals and Risk Factors at Admission - 07/15/23 1042       Core Components/Risk Factors/Patient Goals on Admission   Improve shortness of breath with ADL's Yes    Intervention Provide education, individualized exercise plan and daily activity instruction to help decrease symptoms of SOB with activities of daily living.    Expected Outcomes Short Term: Improve cardiorespiratory fitness to achieve a reduction of symptoms when performing ADLs;Long Term: Be able to perform more ADLs without symptoms or delay the onset of symptoms             Core Components/Risk Factors/Patient Goals Review:   Goals and Risk Factor Review     Row Name 07/29/23 1040             Core Components/Risk Factors/Patient Goals  Review   Personal Goals Review Improve shortness of breath with ADL's;Develop more efficient breathing techniques such as purse lipped breathing and diaphragmatic breathing and practicing self-pacing with activity.       Review Lisa Lawrence has attended two sessions so far. Goal progressing on improving her shortness of breath with ADLs. Goal progressing on developing more efficient breathing techniques such as purse lipped breathing and diaphragmatic breathing; and practicing self-pacing with activity.       Expected Outcomes For Lisa Lawrence to improve her shortness of breath with ADL's and develop  more efficient breathing techniques such as purse lipped breathing and diaphragmatic breathing; and practicing self-pacing with activity.                Core Components/Risk Factors/Patient Goals at Discharge (Final Review):   Goals and Risk Factor Review - 07/29/23 1040       Core Components/Risk Factors/Patient Goals Review   Personal Goals Review Improve shortness of breath with ADL's;Develop more efficient breathing techniques such as purse lipped breathing and diaphragmatic breathing and practicing self-pacing with activity.    Review Lisa Lawrence has attended two sessions so far. Goal progressing on improving her shortness of breath with ADLs. Goal progressing on developing more efficient breathing techniques such as purse lipped breathing and diaphragmatic breathing; and practicing self-pacing with activity.    Expected Outcomes For Lisa Lawrence to improve her shortness of breath with ADL's and develop more efficient breathing techniques such as purse lipped breathing and diaphragmatic breathing; and practicing self-pacing with activity.             ITP Comments:Pt is making expected progress toward Pulmonary Rehab goals after completing 5 sessions. Recommend continued exercise, life style modification, education, and utilization of breathing techniques to increase stamina and strength, while also decreasing  shortness of breath with exertion.  Dr. Mechele Collin is Medical Director for Pulmonary Rehab at [facility].

## 2023-08-08 ENCOUNTER — Encounter (HOSPITAL_COMMUNITY)
Admission: RE | Admit: 2023-08-08 | Discharge: 2023-08-08 | Disposition: A | Discharge status: Home / Self Care | Payer: BC Managed Care – PPO | Source: Ambulatory Visit | Attending: Pulmonary Disease | Admitting: Pulmonary Disease

## 2023-08-08 DIAGNOSIS — R0609 Other forms of dyspnea: Secondary | ICD-10-CM

## 2023-08-08 DIAGNOSIS — U099 Post covid-19 condition, unspecified: Secondary | ICD-10-CM | POA: Diagnosis not present

## 2023-08-08 NOTE — Progress Notes (Signed)
Daily Session Note  Patient Details  Name: Lisa Lawrence MRN: [id number] Date of Birth: November 30, 2000 Referring Provider:   Doristine Devoid Pulmonary Rehab Walk Test from 07/15/2023 in [facility] for Heart, Vascular, & Lung Health  Referring Provider Hunsucker       Encounter Date: 08/08/2023  Check In:  Session Check In - 08/08/23 0831       Check-In   Supervising physician immediately available to respond to emergencies [facility] MD immediately available    Physician(s) Carlos Levering, NP    Location [facility]    Staff Present Essie Hart, RN, BSN;Johan Creveling Charlean Sanfilippo, MS, ACSM-CEP, Exercise Physiologist    Virtual Visit No    Medication changes reported     No    Fall or balance concerns reported    No    Tobacco Cessation No Change    Warm-up and Cool-down Performed as group-led instruction    Resistance Training Performed Yes    VAD Patient? No    PAD/SET Patient? No      Pain Assessment   Currently in Pain? No/denies    Multiple Pain Sites No             Capillary Blood Glucose: No results found for this or any previous visit (from the past 24 hour(s)).    Social History   Tobacco Use  Smoking Status Never  Smokeless Tobacco Never    Goals Met:  Proper associated with RPD/PD & O2 Sat Independence with exercise equipment Exercise tolerated well No report of concerns or symptoms today Strength training completed today  Goals Unmet:  Not Applicable  Comments: Service time is from 0810 to 0927.    Dr. Mechele Collin is Medical Director for Pulmonary Rehab at [facility].

## 2023-08-12 ENCOUNTER — Telehealth (HOSPITAL_COMMUNITY): Payer: Self-pay

## 2023-08-12 NOTE — Telephone Encounter (Signed)
Pt called and left a vm to cancel her appointment for tomorrow!

## 2023-08-13 ENCOUNTER — Encounter (HOSPITAL_COMMUNITY): Payer: BC Managed Care – PPO

## 2023-08-14 ENCOUNTER — Telehealth (HOSPITAL_COMMUNITY): Payer: Self-pay

## 2023-08-14 NOTE — Telephone Encounter (Signed)
Pt called and stated that she is still sick and will need to cancel her appointment for tomorrow due to getting over a sinus infection.

## 2023-08-15 ENCOUNTER — Encounter (HOSPITAL_COMMUNITY): Payer: BC Managed Care – PPO

## 2023-08-20 ENCOUNTER — Encounter (HOSPITAL_COMMUNITY)
Admission: RE | Admit: 2023-08-20 | Discharge: 2023-08-20 | Disposition: A | Discharge status: Home / Self Care | Payer: BC Managed Care – PPO | Source: Ambulatory Visit | Attending: Pulmonary Disease | Admitting: Pulmonary Disease

## 2023-08-20 DIAGNOSIS — Z5189 Encounter for other specified aftercare: Secondary | ICD-10-CM | POA: Diagnosis not present

## 2023-08-20 DIAGNOSIS — U099 Post covid-19 condition, unspecified: Secondary | ICD-10-CM | POA: Diagnosis present

## 2023-08-20 DIAGNOSIS — R0609 Other forms of dyspnea: Secondary | ICD-10-CM | POA: Insufficient documentation

## 2023-08-20 LAB — GLUCOSE, CAPILLARY: Glucose-Capillary: 90 mg/dL (ref 70–99)

## 2023-08-20 NOTE — Progress Notes (Signed)
Daily Session Note  Patient Details  Name: Lisa Lawrence MRN: [id number] Date of Birth: November 06, 2000 Referring Provider:   Doristine Devoid Pulmonary Rehab Walk Test from 07/15/2023 in [facility] for Heart, Vascular, & Lung Health  Referring Provider Hunsucker       Encounter Date: 08/20/2023  Check In:  Session Check In - 08/20/23 0814       Check-In   Supervising physician immediately available to respond to emergencies [facility] MD immediately available    Physician(s) Joni Reining, NP    Location [facility]    Staff Present Essie Hart, RN, BSN;Casey Charlean Sanfilippo, MS, ACSM-CEP, Exercise Physiologist    Virtual Visit No    Medication changes reported     No    Fall or balance concerns reported    No    Tobacco Cessation No Change    Warm-up and Cool-down Performed as group-led instruction    Resistance Training Performed Yes    VAD Patient? No    PAD/SET Patient? No      Pain Assessment   Currently in Pain? No/denies    Multiple Pain Sites No             Capillary Blood Glucose: No results found for this or any previous visit (from the past 24 hour(s)).    Social History   Tobacco Use  Smoking Status Never  Smokeless Tobacco Never    Goals Met:  Independence with exercise equipment  Goals Unmet:  Hypotensive post exercise  Comments: Service time is from 0808 to 0925    Dr. Mechele Collin is Medical Director for Pulmonary Rehab at [facility].

## 2023-08-20 NOTE — Progress Notes (Signed)
Pt finished exercising for 30 min in Pulm Rehab. During cool down pt reported dizziness/lightheadedness, feeling clammy. Pt stated she ate a granola bar this AM before exercise. VS 0855 100% RA, HR 89, CBG 90, BP unable to obtain manually, doppler used, systolic 60s. Pt taken to exam room, placed in Trendelenburg position. 4oz juice and graham crackers provided. VS recheck at 0920: BP doppler sys 98, HR 101, O2 100% RA, pt states she's feeling better. Manual BP 90/56. Denies lightheadedness/dizziness.  Orthostatic VS: Laying 90/56 Sitting 98/56 Standing 96/54  Pt stated she's feeling back to normal. Pt discharged home.

## 2023-08-22 ENCOUNTER — Encounter (HOSPITAL_COMMUNITY)
Admission: RE | Admit: 2023-08-22 | Discharge: 2023-08-22 | Disposition: A | Discharge status: Home / Self Care | Payer: BC Managed Care – PPO | Source: Ambulatory Visit | Attending: Pulmonary Disease | Admitting: Pulmonary Disease

## 2023-08-22 DIAGNOSIS — R0609 Other forms of dyspnea: Secondary | ICD-10-CM

## 2023-08-22 DIAGNOSIS — U099 Post covid-19 condition, unspecified: Secondary | ICD-10-CM

## 2023-08-22 NOTE — Progress Notes (Signed)
Daily Session Note  Patient Details  Name: Lisa Lawrence MRN: [id number] Date of Birth: 29-Mar-2001 Referring Provider:   Doristine Devoid Pulmonary Rehab Walk Test from 07/15/2023 in [facility] for Heart, Vascular, & Lung Health  Referring Provider Hunsucker       Encounter Date: 08/22/2023  Check In:  Session Check In - 08/22/23 5284       Check-In   Supervising physician immediately available to respond to emergencies [facility] MD immediately available    Physician(s) Bernadene Person, NP    Location [facility]    Staff Present Essie Hart, RN, BSN;Casey Katrinka Blazing, Zella Richer, MS, ACSM-CEP, Exercise Physiologist;Randi Dionisio Paschal, ACSM-CEP, Exercise Physiologist    Virtual Visit No    Medication changes reported     No    Fall or balance concerns reported    No    Tobacco Cessation No Change    Warm-up and Cool-down Performed as group-led instruction    Resistance Training Performed Yes    VAD Patient? No    PAD/SET Patient? No      Pain Assessment   Currently in Pain? No/denies    Multiple Pain Sites No             Capillary Blood Glucose: No results found for this or any previous visit (from the past 24 hour(s)).    Social History   Tobacco Use  Smoking Status Never  Smokeless Tobacco Never    Goals Met:  Proper associated with RPD/PD & O2 Sat Independence with exercise equipment Exercise tolerated well No report of concerns or symptoms today Strength training completed today  Goals Unmet:  Not Applicable  Comments: Service time is from 0810 to 0928.    Dr. Mechele Collin is Medical Director for Pulmonary Rehab at [facility].

## 2023-08-27 ENCOUNTER — Encounter (HOSPITAL_COMMUNITY)
Admission: RE | Admit: 2023-08-27 | Discharge: 2023-08-27 | Disposition: A | Discharge status: Home / Self Care | Payer: BC Managed Care – PPO | Source: Ambulatory Visit | Attending: Pulmonary Disease | Admitting: Pulmonary Disease

## 2023-08-27 DIAGNOSIS — R0609 Other forms of dyspnea: Secondary | ICD-10-CM | POA: Diagnosis not present

## 2023-08-27 NOTE — Progress Notes (Signed)
Daily Session Note  Patient Details  Name: Lisa Lawrence MRN: [id number] Date of Birth: 2000/11/13 Referring Provider:   Doristine Devoid Pulmonary Rehab Walk Test from 07/15/2023 in [facility] for Heart, Vascular, & Lung Health  Referring Provider Hunsucker       Encounter Date: 08/27/2023  Check In:  Session Check In - 08/27/23 0814       Check-In   Supervising physician immediately available to respond to emergencies [facility] MD immediately available    Physician(s) Bernadene Person, NP    Location [facility]    Staff Present Essie Hart, RN, BSN;Hildagard Sobecki Charlean Sanfilippo, MS, ACSM-CEP, Exercise Physiologist    Virtual Visit No    Medication changes reported     No    Fall or balance concerns reported    No    Tobacco Cessation No Change    Warm-up and Cool-down Performed as group-led instruction    Resistance Training Performed Yes    VAD Patient? No    PAD/SET Patient? No      Pain Assessment   Currently in Pain? No/denies    Multiple Pain Sites No             Capillary Blood Glucose: No results found for this or any previous visit (from the past 24 hour(s)).    Social History   Tobacco Use  Smoking Status Never  Smokeless Tobacco Never    Goals Met:  Proper associated with RPD/PD & O2 Sat Independence with exercise equipment Exercise tolerated well No report of concerns or symptoms today Strength training completed today  Goals Unmet:  Not Applicable  Comments: Service time is from 0807 to 0909.    Dr. Mechele Collin is Medical Director for Pulmonary Rehab at [facility].

## 2023-08-29 ENCOUNTER — Encounter (HOSPITAL_COMMUNITY)
Admission: RE | Admit: 2023-08-29 | Discharge: 2023-08-29 | Disposition: A | Discharge status: Home / Self Care | Payer: BC Managed Care – PPO | Source: Ambulatory Visit | Attending: Pulmonary Disease | Admitting: Pulmonary Disease

## 2023-08-29 VITALS — Wt 132.7 lb

## 2023-08-29 DIAGNOSIS — R0609 Other forms of dyspnea: Secondary | ICD-10-CM

## 2023-08-29 DIAGNOSIS — U099 Post covid-19 condition, unspecified: Secondary | ICD-10-CM

## 2023-08-29 NOTE — Progress Notes (Signed)
Daily Session Note  Patient Details  Name: Lisa Lawrence MRN: [id number] Date of Birth: [birth year] Referring Provider:   Doristine Devoid Pulmonary Rehab Walk Test from 07/15/2023 in [facility] for Heart, Vascular, & Lung Health  Referring Provider Hunsucker       Encounter Date: 08/29/2023  Check In:  Session Check In - 08/29/23 4098       Check-In   Supervising physician immediately available to respond to emergencies [facility] MD immediately available    Physician(s) Reather Littler, NP    Location [facility]    Staff Present Essie Hart, RN, BSN;Mileah Hemmer, Zella Richer, MS, ACSM-CEP, Exercise Physiologist;Randi Idelle Crouch BS, ACSM-CEP, Exercise Physiologist;Samantha Belarus, RD, LDN    Virtual Visit No    Medication changes reported     No    Fall or balance concerns reported    No    Tobacco Cessation No Change    Warm-up and Cool-down Performed as group-led instruction    Resistance Training Performed Yes    VAD Patient? No    PAD/SET Patient? No      Pain Assessment   Currently in Pain? No/denies    Multiple Pain Sites No             Capillary Blood Glucose: No results found for this or any previous visit (from the past 24 hour(s)).    Social History   Tobacco Use  Smoking Status Never  Smokeless Tobacco Never    Goals Met:  Proper associated with RPD/PD & O2 Sat Independence with exercise equipment Exercise tolerated well No report of concerns or symptoms today Strength training completed today  Goals Unmet:  Not Applicable  Comments: Service time is from 0811 to 0925.    Dr. Mechele Collin is Medical Director for Pulmonary Rehab at [facility].

## 2023-09-03 ENCOUNTER — Encounter (HOSPITAL_COMMUNITY): Payer: Self-pay

## 2023-09-03 ENCOUNTER — Telehealth (HOSPITAL_COMMUNITY): Payer: Self-pay | Admitting: *Deleted

## 2023-09-03 ENCOUNTER — Encounter (HOSPITAL_COMMUNITY): Payer: BC Managed Care – PPO

## 2023-09-03 NOTE — Telephone Encounter (Signed)
Lisa Lawrence called to cancel her appointment today for pulmonary rehab.  Ethelda Chick BS, ACSM-CEP 09/03/2023 9:24 AM

## 2023-09-04 NOTE — Progress Notes (Signed)
Pulmonary Individual Treatment Plan  Patient Details  Name: Lisa Lawrence MRN: [id number] Date of Birth: 04/12/2001 Referring Provider:   Doristine Devoid Pulmonary Rehab Walk Test from 07/15/2023 in [facility] for Heart, Vascular, & Lung Health  Referring Provider Hunsucker       Initial Encounter Date:  Flowsheet Row Pulmonary Rehab Walk Test from 07/15/2023 in [facility] for Heart, Vascular, & Lung Health  Date 07/15/23       Visit Diagnosis: DOE (dyspnea on exertion)  Post covid-19 condition, unspecified  Patient's Home Medications on Admission:   Current Outpatient Medications:    albuterol (VENTOLIN HFA) 108 (90 Base) MCG/ACT inhaler, Inhale 1-2 puffs into the lungs every 6 (six) hours as needed for wheezing or shortness of breath., Disp: 18 g, Rfl: 6   Ascorbic Acid (VITAMIN C PO), Take 1 tablet by mouth daily. Elderberry, Disp: , Rfl:    Budeson-Glycopyrrol-Formoterol (BREZTRI AEROSPHERE) 160-9-4.8 MCG/ACT AERO, Inhale 2 puffs into the lungs in the morning and at bedtime., Disp: 1 each, Rfl: 11   Cyanocobalamin (VITAMIN B-12 IJ), Inject as directed every 14 (fourteen) days., Disp: , Rfl:    ferrous sulfate 325 (65 FE) MG tablet, Take 325 mg by mouth daily with breakfast., Disp: , Rfl:    MAGNESIUM PO, Take 1 tablet by mouth daily., Disp: , Rfl:    metoprolol tartrate (LOPRESSOR) 25 MG tablet, Take 12.5 mg by mouth 2 (two) times daily., Disp: , Rfl:    nitrofurantoin, macrocrystal-monohydrate, (MACROBID) 100 MG capsule, Take 100 mg by mouth 2 (two) times daily. (Patient not taking: Reported on 07/15/2023), Disp: , Rfl:    Syringe/Needle, Disp, (SYRINGE 3CC/25GX1") 25G X 1" 3 ML MISC, Use once a month with b12, Disp: , Rfl:    TURMERIC PO, Take 1 tablet by mouth daily., Disp: , Rfl:   Past Medical History: No past medical history on file.  Tobacco Use: Social History   Tobacco Use  Smoking Status Never  Smokeless  Tobacco Never    Labs: Review Flowsheet       Latest Ref Rng & Units 02/01/2023  Labs for ITP Cardiac and Pulmonary Rehab  Bicarbonate 20.0 - 28.0 mmol/L 18.8  19.2  19.0   TCO2 22 - 32 mmol/L 20  20  20    Acid-base deficit 0.0 - 2.0 mmol/L 4.0  3.0  4.0   O2 Saturation % 82  86  88     Details       Multiple values from one day are sorted in reverse-chronological order         Capillary Blood Glucose: Lab Results  Component Value Date   GLUCAP 90 08/20/2023     Pulmonary Assessment Scores:  Pulmonary Assessment Scores     Row Name 07/15/23 1059         ADL UCSD   ADL Phase Entry     SOB Score total 54       CAT Score   CAT Score 11       mMRC Score   mMRC Score 2             UCSD: Self-administered rating of dyspnea associated with activities of daily living (ADLs) 6-point scale (0 = "not at all" to 5 = "maximal or unable to do because of breathlessness")  Scoring Scores range from 0 to 120.  Minimally important difference is 5 units  CAT: CAT can identify the health impairment of COPD patients and  is better correlated with disease progression.  CAT has a scoring range of zero to 40. The CAT score is classified into four groups of low (less than 10), medium (10 - 20), high (21-30) and very high (31-40) based on the impact level of disease on health status. A CAT score over 10 suggests significant symptoms.  A worsening CAT score could be explained by an exacerbation, poor medication adherence, poor inhaler technique, or progression of COPD or comorbid conditions.  CAT MCID is 2 points  mMRC: mMRC (Modified Medical Research Council) Dyspnea Scale is used to assess the degree of baseline functional disability in patients of respiratory disease due to dyspnea. No minimal important difference is established. A decrease in score of 1 point or greater is considered a positive change.   Pulmonary Function Assessment:  Pulmonary Function Assessment -  07/15/23 1045       Breath   Bilateral Breath Sounds Clear    Shortness of Breath Fear of Shortness of Breath;Yes;Limiting activity;Panic with Shortness of Breath             Exercise Target Goals: Exercise Program Goal: Individual exercise prescription set using results from initial 6 min walk test and THRR while considering  patient's activity barriers and safety.   Exercise Prescription Goal: Initial exercise prescription builds to 30-45 minutes a day of aerobic activity, 2-3 days per week.  Home exercise guidelines will be given to patient during program as part of exercise prescription that the participant will acknowledge.  Activity Barriers & Risk Stratification:  Activity Barriers & Cardiac Risk Stratification - 07/15/23 1043       Activity Barriers & Cardiac Risk Stratification   Activity Barriers Deconditioning;Shortness of Breath;Chest Pain/Angina;Muscular Weakness   pt states she has chest pain when she experiences tachycardia   Cardiac Risk Stratification Low             6 Minute Walk:  6 Minute Walk     Row Name 07/15/23 1132         6 Minute Walk   Phase Initial     Distance 1690 feet     Walk Time 6 minutes     # of Rest Breaks 0     MPH 3.2     METS 4.22     RPE 7     Perceived Dyspnea  1     VO2 Peak 14.78     Symptoms No     Resting HR 73 bpm     Resting BP 104/70     Resting Oxygen Saturation  100 %     Exercise Oxygen Saturation  during 6 min walk 98 %     Max Ex. HR 117 bpm     Max Ex. BP 120/68     2 Minute Post BP 106/60       Interval HR   1 Minute HR 95     2 Minute HR 104     3 Minute HR 103     4 Minute HR 110     5 Minute HR 114     6 Minute HR 117     2 Minute Post HR 93     Interval Heart Rate? Yes       Interval Oxygen   Interval Oxygen? Yes     Baseline Oxygen Saturation % 100 %     1 Minute Oxygen Saturation % 100 %     1 Minute Liters of Oxygen 0 L  2 Minute Oxygen Saturation % 98 %     2 Minute Liters  of Oxygen 0 L     3 Minute Oxygen Saturation % 100 %     3 Minute Liters of Oxygen 0 L     4 Minute Oxygen Saturation % 98 %     4 Minute Liters of Oxygen 0 L     5 Minute Oxygen Saturation % 98 %     5 Minute Liters of Oxygen 0 L     6 Minute Oxygen Saturation % 99 %     6 Minute Liters of Oxygen 0 L     2 Minute Post Oxygen Saturation % 100 %     2 Minute Post Liters of Oxygen 0 L              Oxygen Initial Assessment:  Oxygen Initial Assessment - 07/15/23 1045       Home Oxygen   Home Oxygen Device None    Sleep Oxygen Prescription None    Home Exercise Oxygen Prescription None    Home Resting Oxygen Prescription None      Initial 6 min Walk   Oxygen Used None      Program Oxygen Prescription   Program Oxygen Prescription None      Intervention   Short Term Goals To learn and understand importance of monitoring SPO2 with pulse oximeter and demonstrate accurate use of the pulse oximeter.;To learn and understand importance of maintaining oxygen saturations>88%;To learn and demonstrate proper pursed lip breathing techniques or other breathing techniques. ;To learn and demonstrate proper use of respiratory medications    Long  Term Goals Maintenance of O2 saturations>88%;Compliance with respiratory medication;Verbalizes importance of monitoring SPO2 with pulse oximeter and return demonstration;Exhibits proper breathing techniques, such as pursed lip breathing or other method taught during program session;Demonstrates proper use of MDI's             Oxygen Re-Evaluation:  Oxygen Re-Evaluation     Row Name 08/01/23 1621 08/28/23 0951           Program Oxygen Prescription   Program Oxygen Prescription None None        Home Oxygen   Home Oxygen Device None None      Sleep Oxygen Prescription None None      Home Exercise Oxygen Prescription None None      Home Resting Oxygen Prescription None None        Goals/Expected Outcomes   Short Term Goals To learn and  understand importance of monitoring SPO2 with pulse oximeter and demonstrate accurate use of the pulse oximeter.;To learn and understand importance of maintaining oxygen saturations>88%;To learn and demonstrate proper pursed lip breathing techniques or other breathing techniques. ;To learn and demonstrate proper use of respiratory medications To learn and understand importance of monitoring SPO2 with pulse oximeter and demonstrate accurate use of the pulse oximeter.;To learn and understand importance of maintaining oxygen saturations>88%;To learn and demonstrate proper pursed lip breathing techniques or other breathing techniques. ;To learn and demonstrate proper use of respiratory medications      Long  Term Goals Maintenance of O2 saturations>88%;Compliance with respiratory medication;Verbalizes importance of monitoring SPO2 with pulse oximeter and return demonstration;Exhibits proper breathing techniques, such as pursed lip breathing or other method taught during program session;Demonstrates proper use of MDI's Maintenance of O2 saturations>88%;Compliance with respiratory medication;Verbalizes importance of monitoring SPO2 with pulse oximeter and return demonstration;Exhibits proper breathing techniques, such as pursed lip breathing or other method  taught during program session;Demonstrates proper use of MDI's      Goals/Expected Outcomes Compliance and understanding of oxygen saturation monitoring and breathing techniques to decrease shortness of breath Compliance and understanding of oxygen saturation monitoring and breathing techniques to decrease shortness of breath               Oxygen Discharge (Final Oxygen Re-Evaluation):  Oxygen Re-Evaluation - 08/28/23 0951       Program Oxygen Prescription   Program Oxygen Prescription None      Home Oxygen   Home Oxygen Device None    Sleep Oxygen Prescription None    Home Exercise Oxygen Prescription None    Home Resting Oxygen Prescription None       Goals/Expected Outcomes   Short Term Goals To learn and understand importance of monitoring SPO2 with pulse oximeter and demonstrate accurate use of the pulse oximeter.;To learn and understand importance of maintaining oxygen saturations>88%;To learn and demonstrate proper pursed lip breathing techniques or other breathing techniques. ;To learn and demonstrate proper use of respiratory medications    Long  Term Goals Maintenance of O2 saturations>88%;Compliance with respiratory medication;Verbalizes importance of monitoring SPO2 with pulse oximeter and return demonstration;Exhibits proper breathing techniques, such as pursed lip breathing or other method taught during program session;Demonstrates proper use of MDI's    Goals/Expected Outcomes Compliance and understanding of oxygen saturation monitoring and breathing techniques to decrease shortness of breath             Initial Exercise Prescription:  Initial Exercise Prescription - 07/15/23 1100       Date of Initial Exercise RX and Referring Provider   Date 07/15/23    Referring Provider Hunsucker    Expected Discharge Date 10/10/23      Treadmill   MPH 2.7    Grade 0    Minutes 15      Rower   Level 1    Watts 50    Minutes 15      Prescription Details   Frequency (times per week) 2    Duration Progress to 30 minutes of continuous aerobic without signs/symptoms of physical distress      Intensity   THRR 40-80% of Max Heartrate 79-158    Ratings of Perceived Exertion 11-13    Perceived Dyspnea 0-4      Progression   Progression Continue to progress workloads to maintain intensity without signs/symptoms of physical distress.      Resistance Training   Training Prescription Yes    Weight black bands    Reps 10-15             Perform Capillary Blood Glucose checks as needed.  Exercise Prescription Changes:   Exercise Prescription Changes     Row Name 07/23/23 0900 08/06/23 0900 08/29/23 0927          Response to Exercise   Blood Pressure (Admit) 106/64 96/58 100/70     Blood Pressure (Exercise) 122/60 130/68 --     Blood Pressure (Exit) 118/64 96/60 96/56      Heart Rate (Admit) 87 bpm 85 bpm 97 bpm     Heart Rate (Exercise) 144 bpm 141 bpm 151 bpm     Heart Rate (Exit) 126 bpm  pt has baseline tachy 114 bpm 104 bpm     Oxygen Saturation (Admit) 100 % 100 % 100 %     Oxygen Saturation (Exercise) 97 % 99 % 97 %     Oxygen Saturation (Exit) 96 % 96 %  98 %     Rating of Perceived Exertion (Exercise) 13 9 13      Perceived Dyspnea (Exercise) 1 1 1      Duration Progress to 30 minutes of  aerobic without signs/symptoms of physical distress Continue with 30 min of aerobic exercise without signs/symptoms of physical distress. Continue with 30 min of aerobic exercise without signs/symptoms of physical distress.     Intensity THRR unchanged THRR unchanged THRR unchanged       Progression   Progression Continue to progress workloads to maintain intensity without signs/symptoms of physical distress. Continue to progress workloads to maintain intensity without signs/symptoms of physical distress. Continue to progress workloads to maintain intensity without signs/symptoms of physical distress.     Average METs 2.7 -- --       Resistance Training   Training Prescription Yes Yes Yes     Weight blue bands black bands black bands     Reps 10-15 10-15 10-15     Time 10 Minutes 10 Minutes 10 Minutes       Interval Training   Interval Training -- -- Yes     Equipment -- -- Treadmill     Comments -- -- 30 sec @ 5. & 0% incline, 2:30 @ 3.5 mph & 2% incline       Treadmill   MPH 2.5 3.5 5.2     Grade 0 0 0     Minutes 15 15 15      METs -- 3.9 7.6       Bike   Level -- 2 2     Minutes -- 15 15     METs -- 4 3.8       Rower   Level 2 -- --     Watts 40 -- --     Minutes 15 -- --              Exercise Comments:   Exercise Comments     Row Name 07/23/23 0947           Exercise  Comments Pt completed her first day of group exercise. She exercised on the rower for 15 min, level 2, watts average 40. She then walked on the treadmill for 15 min 2.7 mph, 0 incline, METs 2.7. tolerated well. Performed warm up and cool down without limitations. Pt did check out with an elevated HR, which is typical for her. Discussed METs.                Exercise Goals and Review:   Exercise Goals     Row Name 07/15/23 1044             Exercise Goals   Increase Physical Activity Yes       Intervention Provide advice, education, support and counseling about physical activity/exercise needs.;Develop an individualized exercise prescription for aerobic and resistive training based on initial evaluation findings, risk stratification, comorbidities and participant's personal goals.       Expected Outcomes Short Term: Attend rehab on a regular basis to increase amount of physical activity.;Long Term: Add in home exercise to make exercise part of routine and to increase amount of physical activity.;Long Term: Exercising regularly at least 3-5 days a week.       Increase Strength and Stamina Yes       Intervention Provide advice, education, support and counseling about physical activity/exercise needs.;Develop an individualized exercise prescription for aerobic and resistive training based on initial evaluation findings, risk stratification, comorbidities and  participant's personal goals.       Expected Outcomes Short Term: Increase workloads from initial exercise prescription for resistance, speed, and METs.;Short Term: Perform resistance training exercises routinely during rehab and add in resistance training at home;Long Term: Improve cardiorespiratory fitness, muscular endurance and strength as measured by increased METs and functional capacity ( )       Able to understand and use rate of perceived exertion (RPE) scale Yes       Intervention Provide education and explanation on how to use  RPE scale       Expected Outcomes Short Term: Able to use RPE daily in rehab to express subjective intensity level;Long Term:  Able to use RPE to guide intensity level when exercising independently       Able to understand and use Dyspnea scale Yes       Intervention Provide education and explanation on how to use Dyspnea scale       Expected Outcomes Short Term: Able to use Dyspnea scale daily in rehab to express subjective sense of shortness of breath during exertion;Long Term: Able to use Dyspnea scale to guide intensity level when exercising independently       Knowledge and understanding of Target Heart Rate Range (THRR) Yes       Intervention Provide education and explanation of THRR including how the numbers were predicted and where they are located for reference       Expected Outcomes Short Term: Able to state/look up THRR;Short Term: Able to use daily as guideline for intensity in rehab;Long Term: Able to use THRR to govern intensity when exercising independently       Understanding of Exercise Prescription Yes       Intervention Provide education, explanation, and written materials on patient's individual exercise prescription       Expected Outcomes Short Term: Able to explain program exercise prescription;Long Term: Able to explain home exercise prescription to exercise independently                Exercise Goals Re-Evaluation :  Exercise Goals Re-Evaluation     Row Name 08/01/23 1616 08/28/23 0947           Exercise Goal Re-Evaluation   Exercise Goals Review Increase Physical Activity;Able to understand and use Dyspnea scale;Understanding of Exercise Prescription;Increase Strength and Stamina;Knowledge and understanding of Target Heart Rate Range (THRR);Able to understand and use rate of perceived exertion (RPE) scale Increase Physical Activity;Able to understand and use Dyspnea scale;Understanding of Exercise Prescription;Increase Strength and Stamina;Knowledge and  understanding of Target Heart Rate Range (THRR);Able to understand and use rate of perceived exertion (RPE) scale      Comments Rickayla has completed 4 exercise sessions. She exercises for 15 min on the rower and treadmill. Wafa averages 53 watts at level 3 on the rower and 4.4 METs at 3.3 mph and 2% incline. She performs the warmup and cooldown standing without limitations. Naomi has increased her workload for both exercise modes as she tolerates progressions well. Watts and METs have also increased. I am hoping to progressing her to interval running on the treadmill after a couple of week. Will continue to monitor and progress as able. Lucila has completed 9 exercise sessions. She exercises for 15 min on the upright bike and treadmill. Amor averages 4.9 METs at level 3.5 on the upright bike and 4.8 METs at 3.5 mph and 2% incline on the treadmill. She performs the warmup and coodown standing without limitations. Malissie has transferred from the  rower to the upright bike in preparation for interval jogging. She has also progressed to interval jogging. I am unsure how well she tolerates interval jogging due to set up issues with the treadmill. Will guide in set up on the treadmill next sessions. Her upright bike workload had to be decreased from 3.5 to 2 due to her high HR. Will continue to monitor and progress as able.      Expected Outcomes Through exercise at rehab and home, the patient will decrease shortness of breath with daily activities and feel confident in carrying out an exercise regimen at home Through exercise at rehab and home, the patient will decrease shortness of breath with daily activities and feel confident in carrying out an exercise regimen at home               Discharge Exercise Prescription (Final Exercise Prescription Changes):  Exercise Prescription Changes - 08/29/23 0927       Response to Exercise   Blood Pressure (Admit) 100/70    Blood Pressure (Exit) 96/56    Heart  Rate (Admit) 97 bpm    Heart Rate (Exercise) 151 bpm    Heart Rate (Exit) 104 bpm    Oxygen Saturation (Admit) 100 %    Oxygen Saturation (Exercise) 97 %    Oxygen Saturation (Exit) 98 %    Rating of Perceived Exertion (Exercise) 13    Perceived Dyspnea (Exercise) 1    Duration Continue with 30 min of aerobic exercise without signs/symptoms of physical distress.    Intensity THRR unchanged      Progression   Progression Continue to progress workloads to maintain intensity without signs/symptoms of physical distress.      Resistance Training   Training Prescription Yes    Weight black bands    Reps 10-15    Time 10 Minutes      Interval Training   Interval Training Yes    Equipment Treadmill    Comments 30 sec @ 5. & 0% incline, 2:30 @ 3.5 mph & 2% incline      Treadmill   MPH 5.2    Grade 0    Minutes 15    METs 7.6      Bike   Level 2    Minutes 15    METs 3.8             Nutrition:  Target Goals: Understanding of nutrition guidelines, daily intake of sodium 1500mg , cholesterol 200mg , calories 30% from fat and 7% or less from saturated fats, daily to have 5 or more servings of fruits and vegetables.  Biometrics:  Pre Biometrics - 07/15/23 1143       Pre Biometrics   Grip Strength 30 kg              Nutrition Therapy Plan and Nutrition Goals:  Nutrition Therapy & Goals - 08/23/23 1553       Nutrition Therapy   Diet General Healthy Diet      Personal Nutrition Goals   Nutrition Goal Patient to monitor sodium and fluid intake regularly.   goals in progress.   Personal Goal #2 Patient to improve diet quality by using the plate method as a guide for meal planning to include lean protein/plant protein, fruits, vegetables, whole grains, nonfat dairy as part of a well-balanced diet.   goal in progress.   Comments Goals in progress. 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 She has been worked up for POTS but  did not meet  diagnosis criteria. Kenadi denies nutrition concerns at this time. She eats three meals daily and does enjoy a wide variety of foods. She reports getting 50-64oz of fluids daily. She does not drink caffeine. She continues PO iron and bi-monthly B12 injections. We have discussed increasing fluid intake to 2-3L daily and moderate salt intake to aid with long covid associated tachycardia. She has maintained her weight since starting with our program. Patient will benefit from participation in pulmonary rehab for nutrition, exercise, and lifestyle modification.      Intervention Plan   Intervention Prescribe, educate and counsel regarding individualized specific dietary modifications aiming towards targeted core components such as weight, hypertension, lipid management, diabetes, heart failure and other comorbidities.;Nutrition handout(s) given to patient.    Expected Outcomes Short Term Goal: Understand basic principles of dietary content, such as calories, fat, sodium, cholesterol and nutrients.;Long Term Goal: Adherence to prescribed nutrition plan.             Nutrition Assessments:  Nutrition Assessments - 07/25/23 1014       Rate Your Plate Scores   Pre Score 56            MEDIFICTS Score Key: >=70 Need to make dietary changes  40-70 Heart Healthy Diet <= 40 Therapeutic Level Cholesterol Diet  Flowsheet Row PULMONARY REHAB CHRONIC OBSTRUCTIVE PULMONARY DISEASE from 07/25/2023 in [facility] for Heart, Vascular, & Lung Health  Picture Your Plate Total Score on Admission 56      Picture Your Plate Scores: <86 Unhealthy dietary pattern with much room for improvement. 41-50 Dietary pattern unlikely to meet recommendations for good health and room for improvement. 51-60 More healthful dietary pattern, with some room for improvement.  >60 Healthy dietary pattern, although there may be some specific behaviors that could be improved.    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 She has been worked up for POTS but did not meet diagnosis criteria. Lamara denies nutrition concerns at this time. She eats three meals daily and does enjoy a wide variety of foods. She reports getting 50-64oz of fluids daily. She does not drink caffeine. She continues PO iron and bi-monthly B12 injections. Patient will benefit from participation in pulmonary rehab for nutrition, exercise, and lifestyle modification. Goals in progress. 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 She has been worked up for POTS but did not meet diagnosis criteria. Xiana denies nutrition concerns at this time. She eats three meals daily and does enjoy a wide variety of foods. She reports getting 50-64oz of fluids daily. She does not drink caffeine. She continues PO iron and bi-monthly B12 injections. We have discussed increasing fluid intake to 2-3L daily and moderate salt intake to aid with long covid associated tachycardia. She has maintained her weight since starting with our program. Patient will benefit from participation in pulmonary rehab for nutrition, exercise, and lifestyle modification.               Nutrition Goals Discharge (Final Nutrition Goals Re-Evaluation):  Nutrition Goals Re-Evaluation - 08/23/23 1553  Goals   Current Weight 131 lb 6.3 oz (59.6 kg)    Comment no new labs; most recent labs WNL    Expected Outcome Goals in progress. 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 She has been worked up for POTS but did not meet diagnosis criteria. Kahley denies nutrition concerns at this time. She eats three meals daily and does enjoy a wide  variety of foods. She reports getting 50-64oz of fluids daily. She does not drink caffeine. She continues PO iron and bi-monthly B12 injections. We have discussed increasing fluid intake to 2-3L daily and moderate salt intake to aid with long covid associated tachycardia. She has maintained her weight since starting with our program. Patient will benefit from participation in pulmonary rehab for nutrition, exercise, and lifestyle modification.             Psychosocial: Target Goals: Acknowledge presence or absence of significant depression and/or stress, maximize coping skills, provide positive support system. Participant is able to verbalize types and ability to use techniques and skills needed for reducing stress and depression.  Initial Review & Psychosocial Screening:  Initial Psych Review & Screening - 07/15/23 1041       Initial Review   Current issues with Current Anxiety/Panic      Family Dynamics   Good Support System? Yes    Comments Ijah states she gets anxious when she experiences SOB      Barriers   Psychosocial barriers to participate in program The patient should benefit from training in stress management and relaxation.      Screening Interventions   Interventions Encouraged to exercise             Quality of Life Scores:  Scores of 19 and below usually indicate a poorer quality of life in these areas.  A difference of  2-3 points is a clinically meaningful difference.  A difference of 2-3 points in the total score of the Quality of Life Index has been associated with significant improvement in overall quality of life, self-image, physical symptoms, and general health in studies assessing change in quality of life.  PHQ-9: Review Flowsheet       07/15/2023  Depression screen PHQ 2/9  Decreased Interest 1  Down, Depressed, Hopeless 0  PHQ - 2 Score 1  Altered sleeping 0  Tired, decreased energy 1  Change in appetite 0  Feeling bad or failure about  yourself  0  Trouble concentrating 0  Moving slowly or fidgety/restless 0  Suicidal thoughts 0  PHQ-9 Score 2  Difficult doing work/chores Somewhat difficult    Details           Interpretation of Total Score  Total Score Depression Severity:  1-4 = Minimal depression, 5-9 = Mild depression, 10-14 = Moderate depression, 15-19 = Moderately severe depression, 20-27 = Severe depression   Psychosocial Evaluation and Intervention:  Psychosocial Evaluation - 07/15/23 1042       Psychosocial Evaluation & Interventions   Interventions Encouraged to exercise with the program and follow exercise prescription    Comments Malika states she experiences anxiety when she gets SOB. She denies any other psychosocial barriers or concerns.    Expected Outcomes For Jaleyah to participate in the program free of any psychosocial barriers or concerns.    Continue Psychosocial Services  No Follow up required             Psychosocial Re-Evaluation:  Psychosocial Re-Evaluation     Row Name 07/29/23  1038 08/27/23 1530           Psychosocial Re-Evaluation   Current issues with Current Anxiety/Panic Current Anxiety/Panic      Comments Mackinsey has attended 2 sessions so far. She denies any new psychosocial barriers or concerns at this time. Ariellah states her anxiety is controlled at this time. No other concerns or barriers at this time.      Expected Outcomes For Amana to partcipate in [place] free of any psychosocial barriers or concerns. For Milca to partcipate in [place] free of any psychosocial barriers or concerns.      Interventions Encouraged to attend Pulmonary Rehabilitation for the exercise Encouraged to attend Pulmonary Rehabilitation for the exercise      Continue Psychosocial Services  No Follow up required No Follow up required               Psychosocial Discharge (Final Psychosocial Re-Evaluation):  Psychosocial Re-Evaluation - 08/27/23 1530       Psychosocial Re-Evaluation    Current issues with Current Anxiety/Panic    Comments Leba states her anxiety is controlled at this time. No other concerns or barriers at this time.    Expected Outcomes For Alanda to partcipate in [place] free of any psychosocial barriers or concerns.    Interventions Encouraged to attend Pulmonary Rehabilitation for the exercise    Continue Psychosocial Services  No Follow up required             Education: Education Goals: Education classes will be provided on a weekly basis, covering required topics. Participant will state understanding/return demonstration of topics presented.  Learning Barriers/Preferences:  Learning Barriers/Preferences - 07/15/23 1042       Learning Barriers/Preferences   Learning Barriers None    Learning Preferences None             Education Topics: Know Your Numbers Group instruction that is supported by a PowerPoint presentation. Instructor discusses importance of knowing and understanding resting, exercise, and post-exercise oxygen saturation, heart rate, and blood pressure. Oxygen saturation, heart rate, blood pressure, rating of perceived exertion, and dyspnea are reviewed along with a normal range for these values.    Exercise for the Pulmonary Patient Group instruction that is supported by a PowerPoint presentation. Instructor discusses benefits of exercise, core components of exercise, frequency, duration, and intensity of an exercise routine, importance of utilizing pulse oximetry during exercise, safety while exercising, and options of places to exercise outside of rehab.    MET Level  Group instruction provided by PowerPoint, verbal discussion, and written material to support subject matter. Instructor reviews what METs are and how to increase METs.  Flowsheet Row PULMONARY REHAB CHRONIC OBSTRUCTIVE PULMONARY DISEASE from 08/29/2023 in [facility] for Heart, Vascular, & Lung Health  Date 08/29/23  Educator EP   Instruction Review Code 1- Verbalizes Understanding       Pulmonary Medications Verbally interactive group education provided by instructor with focus on inhaled medications and proper administration.   Anatomy and Physiology of the Respiratory System Group instruction provided by PowerPoint, verbal discussion, and written material to support subject matter. Instructor reviews respiratory cycle and anatomical components of the respiratory system and their functions. Instructor also reviews differences in obstructive and restrictive respiratory diseases with examples of each.    Oxygen Safety Group instruction provided by PowerPoint, verbal discussion, and written material to support subject matter. There is an overview of "What is Oxygen" and "Why do we need it".  Instructor also reviews  how to create a safe environment for oxygen use, the importance of using oxygen as prescribed, and the risks of noncompliance. There is a brief discussion on traveling with oxygen and resources the patient may utilize.   Oxygen Use Group instruction provided by PowerPoint, verbal discussion, and written material to discuss how supplemental oxygen is prescribed and different types of oxygen supply systems. Resources for more information are provided.  Flowsheet Row PULMONARY REHAB CHRONIC OBSTRUCTIVE PULMONARY DISEASE from 07/25/2023 in [facility] for Heart, Vascular, & Lung Health  Date 07/25/23  Educator RT  Instruction Review Code 1- Verbalizes Understanding       Breathing Techniques Group instruction that is supported by demonstration and informational handouts. Instructor discusses the benefits of pursed lip and diaphragmatic breathing and detailed demonstration on how to perform both.  Flowsheet Row PULMONARY REHAB CHRONIC OBSTRUCTIVE PULMONARY DISEASE from 08/01/2023 in [facility] for Heart, Vascular, & Lung Health  Date 08/01/23  Educator RN   Instruction Review Code 1- Verbalizes Understanding        Risk Factor Reduction Group instruction that is supported by a PowerPoint presentation. Instructor discusses the definition of a risk factor, different risk factors for pulmonary disease, and how the heart and lungs work together. Flowsheet Row PULMONARY REHAB CHRONIC OBSTRUCTIVE PULMONARY DISEASE from 08/22/2023 in [facility] for Heart, Vascular, & Lung Health  Date 08/22/23  Educator EP  Instruction Review Code 1- Verbalizes Understanding       Pulmonary Diseases Group instruction provided by PowerPoint, verbal discussion, and written material to support subject matter. Instructor gives an overview of the different type of pulmonary diseases. There is also a discussion on risk factors and symptoms as well as ways to manage the diseases.   Stress and Energy Conservation Group instruction provided by PowerPoint, verbal discussion, and written material to support subject matter. Instructor gives an overview of stress and the impact it can have on the body. Instructor also reviews ways to reduce stress. There is also a discussion on energy conservation and ways to conserve energy throughout the day. Flowsheet Row PULMONARY REHAB CHRONIC OBSTRUCTIVE PULMONARY DISEASE from 08/08/2023 in [facility] for Heart, Vascular, & Lung Health  Date 08/08/23  Educator RN  Instruction Review Code 1- Verbalizes Understanding       Warning Signs and Symptoms Group instruction provided by PowerPoint, verbal discussion, and written material to support subject matter. Instructor reviews warning signs and symptoms of stroke, heart attack, cold and flu. Instructor also reviews ways to prevent the spread of infection.   Other Education Group or individual verbal, written, or video instructions that support the educational goals of the pulmonary rehab program.    Knowledge Questionnaire Score:   Knowledge Questionnaire Score - 07/15/23 1101       Knowledge Questionnaire Score   Pre Score 14/18             Core Components/Risk Factors/Patient Goals at Admission:  Personal Goals and Risk Factors at Admission - 07/15/23 1042       Core Components/Risk Factors/Patient Goals on Admission   Improve shortness of breath with ADL's Yes    Intervention Provide education, individualized exercise plan and daily activity instruction to help decrease symptoms of SOB with activities of daily living.    Expected Outcomes Short Term: Improve cardiorespiratory fitness to achieve a reduction of symptoms when performing ADLs;Long Term: Be able to perform more ADLs without symptoms or delay  the onset of symptoms             Core Components/Risk Factors/Patient Goals Review:   Goals and Risk Factor Review     Row Name 07/29/23 1040 08/27/23 1531           Core Components/Risk Factors/Patient Goals Review   Personal Goals Review Improve shortness of breath with ADL's;Develop more efficient breathing techniques such as purse lipped breathing and diaphragmatic breathing and practicing self-pacing with activity. Improve shortness of breath with ADL's      Review Bryley has attended two sessions so far. Goal progressing on improving her shortness of breath with ADLs. Goal progressing on developing more efficient breathing techniques such as purse lipped breathing and diaphragmatic breathing; and practicing self-pacing with activity. Goal progressing on improving her shortness of breath with ADLs. Goal met on developing more efficient breathing techniques such as purse lipped breathing and diaphragmatic breathing; and practicing self-pacing with activity. Arionna is able to demonstrate purse lip breathing when she gets SOB. Sats have remained 97-100% on RA while exercising. We will continue to monitor Winni's progress throughout the program.      Expected Outcomes For Kamorie to improve her  shortness of breath with ADL's and develop more efficient breathing techniques such as purse lipped breathing and diaphragmatic breathing; and practicing self-pacing with activity. For Quantisha to improve her shortness of breath with ADL's.               Core Components/Risk Factors/Patient Goals at Discharge (Final Review):   Goals and Risk Factor Review - 08/27/23 1531       Core Components/Risk Factors/Patient Goals Review   Personal Goals Review Improve shortness of breath with ADL's    Review Goal progressing on improving her shortness of breath with ADLs. Goal met on developing more efficient breathing techniques such as purse lipped breathing and diaphragmatic breathing; and practicing self-pacing with activity. Dariya is able to demonstrate purse lip breathing when she gets SOB. Sats have remained 97-100% on RA while exercising. We will continue to monitor Elizaveta's progress throughout the program.    Expected Outcomes For [place] to improve her shortness of breath with ADL's.             ITP Comments:Pt is making expected progress toward Pulmonary Rehab goals after completing 10 session(s). Recommend continued exercise, life style modification, education, and utilization of breathing techniques to increase stamina and strength, while also decreasing shortness of breath with exertion.  Dr. Mechele Collin is Medical Director for Pulmonary Rehab at [facility].

## 2023-09-10 ENCOUNTER — Telehealth (HOSPITAL_COMMUNITY): Payer: Self-pay | Admitting: *Deleted

## 2023-09-10 ENCOUNTER — Encounter (HOSPITAL_COMMUNITY): Payer: BC Managed Care – PPO

## 2023-09-10 NOTE — Telephone Encounter (Signed)
Pt called out due to weather. Will cancel appointment. Ethelda Chick BS, ACSM-CEP 09/10/2023 7:48 AM

## 2023-09-12 ENCOUNTER — Encounter (HOSPITAL_COMMUNITY)
Admission: RE | Admit: 2023-09-12 | Discharge: 2023-09-12 | Disposition: A | Discharge status: Home / Self Care | Payer: BC Managed Care – PPO | Source: Ambulatory Visit | Attending: Pulmonary Disease | Admitting: Pulmonary Disease

## 2023-09-12 DIAGNOSIS — U099 Post covid-19 condition, unspecified: Secondary | ICD-10-CM | POA: Diagnosis present

## 2023-09-12 DIAGNOSIS — R0609 Other forms of dyspnea: Secondary | ICD-10-CM | POA: Diagnosis present

## 2023-09-12 NOTE — Progress Notes (Signed)
Daily Session Note  Patient Details  Name: Lisa Lawrence MRN: [id number] Date of Birth: 09-04-2001 Referring Provider:   Doristine Devoid Pulmonary Rehab Walk Test from 07/15/2023 in [facility] for Heart, Vascular, & Lung Health  Referring Provider Hunsucker       Encounter Date: 09/12/2023  Check In:  Session Check In - 09/12/23 0817       Check-In   Supervising physician immediately available to respond to emergencies [facility] MD immediately available    Physician(s) Edd Fabian, NP    Location [facility]    Staff Present Essie Hart, RN, BSN;Taner Rzepka Katrinka Blazing, Zella Richer, MS, ACSM-CEP, Exercise Physiologist;Randi Dionisio Paschal, ACSM-CEP, Exercise Physiologist    Virtual Visit No    Medication changes reported     No    Fall or balance concerns reported    No    Tobacco Cessation No Change    Warm-up and Cool-down Performed as group-led instruction    Resistance Training Performed Yes    VAD Patient? No    PAD/SET Patient? No      Pain Assessment   Currently in Pain? No/denies    Multiple Pain Sites No             Capillary Blood Glucose: No results found for this or any previous visit (from the past 24 hour(s)).   Exercise Prescription Changes - 09/12/23 0800       Home Exercise Plan   Plans to continue exercise at Home (comment)    Frequency Add 1 additional day to program exercise sessions.    Initial Home Exercises Provided 09/12/23             Social History   Tobacco Use  Smoking Status Never  Smokeless Tobacco Never    Goals Met:  Proper associated with RPD/PD & O2 Sat Independence with exercise equipment Exercise tolerated well No report of concerns or symptoms today Strength training completed today  Goals Unmet:  Not Applicable  Comments: Service time is from 0805 to 0922.    Dr. Mechele Collin is Medical Director for Pulmonary Rehab at [facility].

## 2023-09-12 NOTE — Progress Notes (Signed)
Home Exercise Prescription I have reviewed a Home Exercise Prescription with Lisa Lawrence. Lisa Lawrence is not currently exercising at home. I encouraged her to walk outside or on a treadmill for 1 non-rehab day/wk for 30 min/day. She agreed with my recommendations. I also discussed tracking her exercise at home using her smartwatch. She voiced understanding. Will follow up on home exercise. The patient stated that their goals were to decrease shortness of breath. We reviewed exercise guidelines, target heart rate during exercise, RPE Scale, weather conditions, endpoints for exercise, warmup and cool down. The patient is encouraged to come to me with any questions. I will continue to follow up with the patient to assist them with progression and safety. Spent 15 min with patient discussing home exercise plan and goals  Joya San, MS, ACSM-CEP 09/12/2023 8:46 AM

## 2023-09-17 ENCOUNTER — Encounter (HOSPITAL_COMMUNITY)
Admission: RE | Admit: 2023-09-17 | Discharge: 2023-09-17 | Disposition: A | Discharge status: Home / Self Care | Payer: BC Managed Care – PPO | Source: Ambulatory Visit | Attending: Pulmonary Disease | Admitting: Pulmonary Disease

## 2023-09-17 VITALS — Wt 130.5 lb

## 2023-09-17 DIAGNOSIS — R0609 Other forms of dyspnea: Secondary | ICD-10-CM | POA: Diagnosis not present

## 2023-09-17 DIAGNOSIS — U099 Post covid-19 condition, unspecified: Secondary | ICD-10-CM

## 2023-09-17 NOTE — Progress Notes (Signed)
Daily Session Note  Patient Details  Name: Lisa Lawrence MRN: [id number] Date of Birth: [birth year] Referring Provider:   Doristine Devoid Pulmonary Rehab Walk Test from 07/15/2023 in [facility] for Heart, Vascular, & Lung Health  Referring Provider Hunsucker       Encounter Date: 09/17/2023  Check In:  Session Check In - 09/17/23 4098       Check-In   Supervising physician immediately available to respond to emergencies [facility] MD immediately available    Physician(s) Carlyon Shadow, NP    Location [facility]    Staff Present Durel Salts, Zella Richer, MS, ACSM-CEP, Exercise Physiologist;Randi Dionisio Paschal, ACSM-CEP, Exercise Physiologist    Virtual Visit No    Medication changes reported     No    Fall or balance concerns reported    No    Tobacco Cessation No Change    Warm-up and Cool-down Performed as group-led instruction    Resistance Training Performed Yes    VAD Patient? No    PAD/SET Patient? No      Pain Assessment   Currently in Pain? No/denies    Multiple Pain Sites No             Capillary Blood Glucose: No results found for this or any previous visit (from the past 24 hour(s)).   Exercise Prescription Changes - 09/17/23 0900       Response to Exercise   Blood Pressure (Admit) 108/58    Blood Pressure (Exercise) 130/60    Blood Pressure (Exit) 90/46    Heart Rate (Admit) 76 bpm    Heart Rate (Exercise) 145 bpm    Heart Rate (Exit) 101 bpm    Oxygen Saturation (Admit) 100 %    Oxygen Saturation (Exercise) 98 %    Oxygen Saturation (Exit) 98 %    Rating of Perceived Exertion (Exercise) 12    Perceived Dyspnea (Exercise) 1    Duration Continue with 30 min of aerobic exercise without signs/symptoms of physical distress.    Intensity THRR unchanged      Progression   Progression Continue to progress workloads to maintain intensity without signs/symptoms of physical distress.      Resistance Training    Training Prescription Yes    Weight black bands    Reps 10-15    Time 10 Minutes      Interval Training   Interval Training Yes    Equipment Treadmill    Comments 30 sec @ 5. & 0% incline, 2:30 @ 3.5 mph & 2% incline      Treadmill   MPH 5.2    Grade 0    Minutes 15    METs 7.6      Bike   Level 2    Minutes 15    METs 3.9             Social History   Tobacco Use  Smoking Status Never  Smokeless Tobacco Never    Goals Met:  Proper associated with RPD/PD & O2 Sat Independence with exercise equipment Exercise tolerated well No report of concerns or symptoms today Strength training completed today  Goals Unmet:  Not Applicable  Comments: Service time is from 0817 to 0926.    Dr. Mechele Collin is Medical Director for Pulmonary Rehab at [facility].

## 2023-09-19 ENCOUNTER — Encounter (HOSPITAL_COMMUNITY)
Admission: RE | Admit: 2023-09-19 | Discharge: 2023-09-19 | Disposition: A | Discharge status: Home / Self Care | Payer: BC Managed Care – PPO | Source: Ambulatory Visit | Attending: Pulmonary Disease | Admitting: Pulmonary Disease

## 2023-09-19 DIAGNOSIS — R0609 Other forms of dyspnea: Secondary | ICD-10-CM | POA: Diagnosis not present

## 2023-09-19 DIAGNOSIS — U099 Post covid-19 condition, unspecified: Secondary | ICD-10-CM

## 2023-09-19 NOTE — Progress Notes (Signed)
Daily Session Note  Patient Details  Name: Lisa Lawrence MRN: [id number] Date of Birth: May 22, 2001 Referring Provider:   Doristine Devoid Pulmonary Rehab Walk Test from 07/15/2023 in [facility] for Heart, Vascular, & Lung Health  Referring Provider Hunsucker       Encounter Date: 09/19/2023  Check In:  Session Check In - 09/19/23 0820       Check-In   Supervising physician immediately available to respond to emergencies [facility] immediately available    Physician(s) Joni Reining, NP    Location [facility]    Staff Present Durel Salts, Zella Richer, MS, ACSM-CEP, Exercise Physiologist;Randi Dionisio Paschal, ACSM-CEP, Exercise Physiologist;Mary Gerre Scull, RN, BSN    Virtual Visit No    Medication changes reported     No    Fall or balance concerns reported    No    Tobacco Cessation No Change    Warm-up and Cool-down Performed as group-led instruction    Resistance Training Performed Yes    VAD Patient? No    PAD/SET Patient? No      Pain Assessment   Currently in Pain? No/denies    Multiple Pain Sites No             Capillary Blood Glucose: No results found for this or any previous visit (from the past 24 hours).    Social History   Tobacco Use  Smoking Status Never  Smokeless Tobacco Never    Goals Met:  Proper associated with RPD/PD & O2 Sat Independence with exercise equipment Exercise tolerated well No report of concerns or symptoms today Strength training completed today  Goals Unmet:  Not Applicable  Comments: Service time is from 0806 to 0919.    Dr. Mechele Collin is Medical Director for Pulmonary Rehab at [facility].

## 2023-09-24 ENCOUNTER — Encounter (HOSPITAL_COMMUNITY)
Admission: RE | Admit: 2023-09-24 | Discharge: 2023-09-24 | Disposition: A | Discharge status: Home / Self Care | Payer: BC Managed Care – PPO | Source: Ambulatory Visit | Attending: Pulmonary Disease | Admitting: Pulmonary Disease

## 2023-09-24 DIAGNOSIS — R0609 Other forms of dyspnea: Secondary | ICD-10-CM

## 2023-09-24 DIAGNOSIS — U099 Post covid-19 condition, unspecified: Secondary | ICD-10-CM

## 2023-09-24 NOTE — Progress Notes (Signed)
Daily Session Note  Patient Details  Name: Lisa Lawrence MRN: [id number] Date of Birth: 12/28/2000 Referring Provider:   Doristine Devoid Pulmonary Rehab Walk Test from 07/15/2023 in [facility] for Heart, Vascular, & Lung Health  Referring Provider Hunsucker       Encounter Date: 09/24/2023  Check In:  Session Check In - 09/24/23 0831       Check-In   Supervising physician immediately available to respond to emergencies [facility] MD immediately available    Physician(s) Bernadene Person, NP    Location [facility]    Staff Present Durel Salts, Zella Richer, MS, ACSM-CEP, Exercise Physiologist;Randi Dionisio Paschal, ACSM-CEP, Exercise Physiologist    Virtual Visit No    Medication changes reported     No    Fall or balance concerns reported    No    Tobacco Cessation No Change    Warm-up and Cool-down Performed as group-led instruction    Resistance Training Performed Yes    VAD Patient? No    PAD/SET Patient? No      Pain Assessment   Currently in Pain? No/denies    Multiple Pain Sites No             Capillary Blood Glucose: No results found for this or any previous visit (from the past 24 hours).    Social History   Tobacco Use  Smoking Status Never  Smokeless Tobacco Never    Goals Met:  Independence with exercise equipment Exercise tolerated well No report of concerns or symptoms today Strength training completed today  Goals Unmet:  Not Applicable  Comments: Service time is from 0810 to 0915    Dr. Mechele Collin is Medical Director for Pulmonary Rehab at [facility].

## 2023-09-26 ENCOUNTER — Encounter (HOSPITAL_COMMUNITY)
Admission: RE | Admit: 2023-09-26 | Discharge: 2023-09-26 | Disposition: A | Discharge status: Home / Self Care | Payer: BC Managed Care – PPO | Source: Ambulatory Visit | Attending: Pulmonary Disease | Admitting: Pulmonary Disease

## 2023-09-26 VITALS — Wt 131.0 lb

## 2023-09-26 DIAGNOSIS — U099 Post covid-19 condition, unspecified: Secondary | ICD-10-CM

## 2023-09-26 DIAGNOSIS — R0609 Other forms of dyspnea: Secondary | ICD-10-CM | POA: Diagnosis not present

## 2023-09-26 NOTE — Progress Notes (Signed)
Daily Session Note  Patient Details  Name: Lisa Lawrence MRN: [id number] Date of Birth: March 10, 2001 Referring Provider:   Doristine Devoid Pulmonary Rehab Walk Test from 07/15/2023 in [facility]  Referring Provider [place]       Encounter Date: 09/26/2023  Check In:  Session Check In - 09/26/23 0831       Check-In   Supervising physician immediately available to respond to emergencies [facility] MD immediately available    Physician(s) Bernadene Person, NP    Location [facility]    Staff Present Durel Salts, Zella Richer, MS, ACSM-CEP, Exercise Physiologist;Mary Gerre Scull, RN, BSN    Virtual Visit No    Medication changes reported     No    Fall or balance concerns reported    No    Tobacco Cessation No Change    Warm-up and Cool-down Performed as group-led instruction    Resistance Training Performed Yes    VAD Patient? No    PAD/SET Patient? No      Pain Assessment   Currently in Pain? No/denies    Multiple Pain Sites No             Capillary Blood Glucose: No results found for this or any previous visit (from the past 24 hours).    Social History   Tobacco Use  Smoking Status Never  Smokeless Tobacco Never    Goals Met:  Proper associated with RPD/PD & O2 Sat Independence with exercise equipment Exercise tolerated well No report of concerns or symptoms today Strength training completed today  Goals Unmet:  Not Applicable  Comments: Service time is from 0814 to 0931.    Dr. Mechele Collin is Medical Director for Pulmonary Rehab at [facility].

## 2023-10-01 ENCOUNTER — Encounter (HOSPITAL_COMMUNITY): Payer: BC Managed Care – PPO

## 2023-10-01 NOTE — Progress Notes (Signed)
Pulmonary Individual Treatment Plan  Patient Details  Name: Lisa Lawrence MRN: [id number] Date of Birth: Jul 21, 2001 Referring Provider:   Doristine Devoid Pulmonary Rehab Walk Test from 07/15/2023 in [facility]  Referring Provider Hunsucker       Initial Encounter Date:  Flowsheet Row Pulmonary Rehab Walk Test from 07/15/2023 in [facility]  Date 07/15/23       Visit Diagnosis: DOE (dyspnea on exertion)  Post covid-19 condition, unspecified  Patient's Home Medications on Admission:   Current Outpatient Medications:    albuterol (VENTOLIN HFA) 108 (90 Base) MCG/ACT inhaler, Inhale 1-2 puffs into the lungs every 6 (six) hours as needed for wheezing or shortness of breath., Disp: 18 g, Rfl: 6   Ascorbic Acid (VITAMIN C PO), Take 1 tablet by mouth daily. Elderberry, Disp: , Rfl:    Budeson-Glycopyrrol-Formoterol (BREZTRI AEROSPHERE) 160-9-4.8 MCG/ACT AERO, Inhale 2 puffs into the lungs in the morning and at bedtime., Disp: 1 each, Rfl: 11   Cyanocobalamin (VITAMIN B-12 IJ), Inject as directed every 14 (fourteen) days., Disp: , Rfl:    ferrous sulfate 325 (65 FE) MG tablet, Take 325 mg by mouth daily with breakfast., Disp: , Rfl:    MAGNESIUM PO, Take 1 tablet by mouth daily., Disp: , Rfl:    metoprolol tartrate (LOPRESSOR) 25 MG tablet, Take 12.5 mg by mouth 2 (two) times daily., Disp: , Rfl:    nitrofurantoin, macrocrystal-monohydrate, (MACROBID) 100 MG capsule, Take 100 mg by mouth 2 (two) times daily. (Patient not taking: Reported on 07/15/2023), Disp: , Rfl:    Syringe/Needle, Disp, (SYRINGE 3CC/25GX1") 25G X 1" 3 ML MISC, Use once a month with b12, Disp: , Rfl:    TURMERIC PO, Take 1 tablet by mouth daily., Disp: , Rfl:   Past Medical History: No past medical history on file.  Tobacco Use: Social History   Tobacco Use  Smoking Status Never  Smokeless  Tobacco Never    Labs: Review Flowsheet       Latest Ref Rng & Units 02/01/2023  Labs for ITP Cardiac and Pulmonary Rehab  Bicarbonate 20.0 - 28.0 mmol/L 18.8  19.2  19.0   TCO2 22 - 32 mmol/L 20  20  20    Acid-base deficit 0.0 - 2.0 mmol/L 4.0  3.0  4.0   O2 Saturation % 82  86  88     Details       Multiple values from one day are sorted in reverse-chronological order         Capillary Blood Glucose: Lab Results  Component Value Date   GLUCAP 90 08/20/2023     Pulmonary Assessment Scores:  Pulmonary Assessment Scores     Row Name 07/15/23 1059         ADL UCSD   ADL Phase Entry     SOB Score total 54       CAT Score   CAT Score 11       mMRC Score   mMRC Score 2             UCSD: Self-administered rating of dyspnea associated with activities of daily living (ADLs) 6-point scale (0 = "not at all" to 5 = "maximal or unable to do because of breathlessness")  Scoring Scores range from 0 to 120.  Minimally important difference is 5 units  CAT: CAT can identify the health impairment of COPD patients and  is better correlated with disease progression.  CAT has a scoring range of zero to 40. The CAT score is classified into four groups of low (less than 10), medium (10 - 20), high (21-30) and very high (31-40) based on the impact level of disease on health status. A CAT score over 10 suggests significant symptoms.  A worsening CAT score could be explained by an exacerbation, poor medication adherence, poor inhaler technique, or progression of COPD or comorbid conditions.  CAT MCID is 2 points  mMRC: mMRC (Modified Medical Research Council) Dyspnea Scale is used to assess the degree of baseline functional disability in patients of respiratory disease due to dyspnea. No minimal important difference is established. A decrease in score of 1 point or greater is considered a positive change.   Pulmonary Function Assessment:  Pulmonary Function Assessment -  07/15/23 1045       Breath   Bilateral Breath Sounds Clear    Shortness of Breath Fear of Shortness of Breath;Yes;Limiting activity;Panic with Shortness of Breath             Exercise Target Goals: Exercise Program Goal: Individual exercise prescription set using results from initial 6 min walk test and THRR while considering  patient's activity barriers and safety.   Exercise Prescription Goal: Initial exercise prescription builds to 30-45 minutes a day of aerobic activity, 2-3 days per week.  Home exercise guidelines will be given to patient during program as part of exercise prescription that the participant will acknowledge.  Activity Barriers & Risk Stratification:  Activity Barriers & Cardiac Risk Stratification - 07/15/23 1043       Activity Barriers & Cardiac Risk Stratification   Activity Barriers Deconditioning;Shortness of Breath;Chest Pain/Angina;Muscular Weakness   pt states she has chest pain when she experiences tachycardia   Cardiac Risk Stratification Low             6 Minute Walk:  6 Minute Walk     Row Name 07/15/23 1132         6 Minute Walk   Phase Initial     Distance 1690 feet     Walk Time 6 minutes     # of Rest Breaks 0     MPH 3.2     METS 4.22     RPE 7     Perceived Dyspnea  1     VO2 Peak 14.78     Symptoms No     Resting HR 73 bpm     Resting BP 104/70     Resting Oxygen Saturation  100 %     Exercise Oxygen Saturation  during 6 min walk 98 %     Max Ex. HR 117 bpm     Max Ex. BP 120/68     2 Minute Post BP 106/60       Interval HR   1 Minute HR 95     2 Minute HR 104     3 Minute HR 103     4 Minute HR 110     5 Minute HR 114     6 Minute HR 117     2 Minute Post HR 93     Interval Heart Rate? Yes       Interval Oxygen   Interval Oxygen? Yes     Baseline Oxygen Saturation % 100 %     1 Minute Oxygen Saturation % 100 %     1 Minute Liters of Oxygen 0 L  2 Minute Oxygen Saturation % 98 %     2 Minute Liters  of Oxygen 0 L     3 Minute Oxygen Saturation % 100 %     3 Minute Liters of Oxygen 0 L     4 Minute Oxygen Saturation % 98 %     4 Minute Liters of Oxygen 0 L     5 Minute Oxygen Saturation % 98 %     5 Minute Liters of Oxygen 0 L     6 Minute Oxygen Saturation % 99 %     6 Minute Liters of Oxygen 0 L     2 Minute Post Oxygen Saturation % 100 %     2 Minute Post Liters of Oxygen 0 L              Oxygen Initial Assessment:  Oxygen Initial Assessment - 07/15/23 1045       Home Oxygen   Home Oxygen Device None    Sleep Oxygen Prescription None    Home Exercise Oxygen Prescription None    Home Resting Oxygen Prescription None      Initial 6 min Walk   Oxygen Used None      Program Oxygen Prescription   Program Oxygen Prescription None      Intervention   Short Term Goals To learn and understand importance of monitoring SPO2 with pulse oximeter and demonstrate accurate use of the pulse oximeter.;To learn and understand importance of maintaining oxygen saturations>88%;To learn and demonstrate proper pursed lip breathing techniques or other breathing techniques. ;To learn and demonstrate proper use of respiratory medications    Long  Term Goals Maintenance of O2 saturations>88%;Compliance with respiratory medication;Verbalizes importance of monitoring SPO2 with pulse oximeter and return demonstration;Exhibits proper breathing techniques, such as pursed lip breathing or other method taught during program session;Demonstrates proper use of MDI's             Oxygen Re-Evaluation:  Oxygen Re-Evaluation     Row Name 08/01/23 1621 08/28/23 0951 09/19/23 1619         Program Oxygen Prescription   Program Oxygen Prescription None None None       Home Oxygen   Home Oxygen Device None None None     Sleep Oxygen Prescription None None None     Home Exercise Oxygen Prescription None None None     Home Resting Oxygen Prescription None None None       Goals/Expected Outcomes    Short Term Goals To learn and understand importance of monitoring SPO2 with pulse oximeter and demonstrate accurate use of the pulse oximeter.;To learn and understand importance of maintaining oxygen saturations>88%;To learn and demonstrate proper pursed lip breathing techniques or other breathing techniques. ;To learn and demonstrate proper use of respiratory medications To learn and understand importance of monitoring SPO2 with pulse oximeter and demonstrate accurate use of the pulse oximeter.;To learn and understand importance of maintaining oxygen saturations>88%;To learn and demonstrate proper pursed lip breathing techniques or other breathing techniques. ;To learn and demonstrate proper use of respiratory medications To learn and understand importance of monitoring SPO2 with pulse oximeter and demonstrate accurate use of the pulse oximeter.;To learn and understand importance of maintaining oxygen saturations>88%;To learn and demonstrate proper pursed lip breathing techniques or other breathing techniques. ;To learn and demonstrate proper use of respiratory medications     Long  Term Goals Maintenance of O2 saturations>88%;Compliance with respiratory medication;Verbalizes importance of monitoring SPO2 with pulse oximeter and return demonstration;Exhibits  proper breathing techniques, such as pursed lip breathing or other method taught during program session;Demonstrates proper use of MDI's Maintenance of O2 saturations>88%;Compliance with respiratory medication;Verbalizes importance of monitoring SPO2 with pulse oximeter and return demonstration;Exhibits proper breathing techniques, such as pursed lip breathing or other method taught during program session;Demonstrates proper use of MDI's Maintenance of O2 saturations>88%;Compliance with respiratory medication;Verbalizes importance of monitoring SPO2 with pulse oximeter and return demonstration;Exhibits proper breathing techniques, such as pursed lip breathing  or other method taught during program session;Demonstrates proper use of MDI's     Goals/Expected Outcomes Compliance and understanding of oxygen saturation monitoring and breathing techniques to decrease shortness of breath Compliance and understanding of oxygen saturation monitoring and breathing techniques to decrease shortness of breath Compliance and understanding of oxygen saturation monitoring and breathing techniques to decrease shortness of breath              Oxygen Discharge (Final Oxygen Re-Evaluation):  Oxygen Re-Evaluation - 09/19/23 1619       Program Oxygen Prescription   Program Oxygen Prescription None      Home Oxygen   Home Oxygen Device None    Sleep Oxygen Prescription None    Home Exercise Oxygen Prescription None    Home Resting Oxygen Prescription None      Goals/Expected Outcomes   Short Term Goals To learn and understand importance of monitoring SPO2 with pulse oximeter and demonstrate accurate use of the pulse oximeter.;To learn and understand importance of maintaining oxygen saturations>88%;To learn and demonstrate proper pursed lip breathing techniques or other breathing techniques. ;To learn and demonstrate proper use of respiratory medications    Long  Term Goals Maintenance of O2 saturations>88%;Compliance with respiratory medication;Verbalizes importance of monitoring SPO2 with pulse oximeter and return demonstration;Exhibits proper breathing techniques, such as pursed lip breathing or other method taught during program session;Demonstrates proper use of MDI's    Goals/Expected Outcomes Compliance and understanding of oxygen saturation monitoring and breathing techniques to decrease shortness of breath             Initial Exercise Prescription:  Initial Exercise Prescription - 07/15/23 1100       Date of Initial Exercise RX and Referring Provider   Date 07/15/23    Referring Provider Hunsucker    Expected Discharge Date 10/10/23       Treadmill   MPH 2.7    Grade 0    Minutes 15      Rower   Level 1    Watts 50    Minutes 15      Prescription Details   Frequency (times per week) 2    Duration Progress to 30 minutes of continuous aerobic without signs/symptoms of physical distress      Intensity   THRR 40-80% of Max Heartrate 79-158    Ratings of Perceived Exertion 11-13    Perceived Dyspnea 0-4      Progression   Progression Continue to progress workloads to maintain intensity without signs/symptoms of physical distress.      Resistance Training   Training Prescription Yes    Weight black bands    Reps 10-15             Perform Capillary Blood Glucose checks as needed.  Exercise Prescription Changes:   Exercise Prescription Changes     Row Name 07/23/23 0900 08/06/23 0900 08/29/23 0927 09/12/23 0800 09/17/23 0900     Response to Exercise   Blood Pressure (Admit) 106/64 96/58 100/70 -- 108/58  Blood Pressure (Exercise) 122/60 130/68 -- -- 130/60   Blood Pressure (Exit) 118/64 96/60 96/56  -- 90/46   Heart Rate (Admit) 87 bpm 85 bpm 97 bpm -- 76 bpm   Heart Rate (Exercise) 144 bpm 141 bpm 151 bpm -- 145 bpm   Heart Rate (Exit) 126 bpm  pt has baseline tachy 114 bpm 104 bpm -- 101 bpm   Oxygen Saturation (Admit) 100 % 100 % 100 % -- 100 %   Oxygen Saturation (Exercise) 97 % 99 % 97 % -- 98 %   Oxygen Saturation (Exit) 96 % 96 % 98 % -- 98 %   Rating of Perceived Exertion (Exercise) 13 9 13  -- 12   Perceived Dyspnea (Exercise) 1 1 1  -- 1   Duration Progress to 30 minutes of  aerobic without signs/symptoms of physical distress Continue with 30 min of aerobic exercise without signs/symptoms of physical distress. Continue with 30 min of aerobic exercise without signs/symptoms of physical distress. -- Continue with 30 min of aerobic exercise without signs/symptoms of physical distress.   Intensity THRR unchanged THRR unchanged THRR unchanged -- THRR unchanged     Progression   Progression Continue  to progress workloads to maintain intensity without signs/symptoms of physical distress. Continue to progress workloads to maintain intensity without signs/symptoms of physical distress. Continue to progress workloads to maintain intensity without signs/symptoms of physical distress. -- Continue to progress workloads to maintain intensity without signs/symptoms of physical distress.   Average METs 2.7 -- -- -- --     Resistance Training   Training Prescription Yes Yes Yes -- Yes   Weight blue bands black bands black bands -- black bands   Reps 10-15 10-15 10-15 -- 10-15   Time 10 Minutes 10 Minutes 10 Minutes -- 10 Minutes     Interval Training   Interval Training -- -- Yes -- Yes   Equipment -- -- Treadmill -- Treadmill   Comments -- -- 30 sec @ 5. & 0% incline, 2:30 @ 3.5 mph & 2% incline -- 30 sec @ 5. & 0% incline, 2:30 @ 3.5 mph & 2% incline     Treadmill   MPH 2.5 3.5 5.2 -- 5.2   Grade 0 0 0 -- 0   Minutes 15 15 15  -- 15   METs -- 3.9 7.6 -- 7.6     Bike   Level -- 2 2 -- 2   Minutes -- 15 15 -- 15   METs -- 4 3.8 -- 3.9     Rower   Level 2 -- -- -- --   Watts 40 -- -- -- --   Minutes 15 -- -- -- --     Home Exercise Plan   Plans to continue exercise at -- -- -- Home (comment) --   Frequency -- -- -- Add 1 additional day to program exercise sessions. --   Initial Home Exercises Provided -- -- -- 09/12/23 --            Exercise Comments:   Exercise Comments     Row Name 07/23/23 0947 09/12/23 0844         Exercise Comments Pt completed her first day of group exercise. She exercised on the rower for 15 min, level 2, watts average 40. She then walked on the treadmill for 15 min 2.7 mph, 0 incline, METs 2.7. tolerated well. Performed warm up and cool down without limitations. Pt did check out with an elevated HR, which is typical for  her. Discussed METs. Completed home ExRx. Lisa Lawrence is not currently exercising at home. I encouraged her to walk outside or on  a treadmill for 1 non-rehab day/wk for 30 min/day. She agreed with my recommendations. I also discussed tracking her exercise at home using her smartwatch. She voiced understanding. Will follow up on home exercise.               Exercise Goals and Review:   Exercise Goals     Row Name 07/15/23 1044             Exercise Goals   Increase Physical Activity Yes       Intervention Provide advice, education, support and counseling about physical activity/exercise needs.;Develop an individualized exercise prescription for aerobic and resistive training based on initial evaluation findings, risk stratification, comorbidities and participant's personal goals.       Expected Outcomes Short Term: Attend rehab on a regular basis to increase amount of physical activity.;Long Term: Add in home exercise to make exercise part of routine and to increase amount of physical activity.;Long Term: Exercising regularly at least 3-5 days a week.       Increase Strength and Stamina Yes       Intervention Provide advice, education, support and counseling about physical activity/exercise needs.;Develop an individualized exercise prescription for aerobic and resistive training based on initial evaluation findings, risk stratification, comorbidities and participant's personal goals.       Expected Outcomes Short Term: Increase workloads from initial exercise prescription for resistance, speed, and METs.;Short Term: Perform resistance training exercises routinely during rehab and add in resistance training at home;Long Term: Improve cardiorespiratory fitness, muscular endurance and strength as measured by increased METs and functional capacity ( )       Able to understand and use rate of perceived exertion (RPE) scale Yes       Intervention Provide education and explanation on how to use RPE scale       Expected Outcomes Short Term: Able to use RPE daily in rehab to express subjective intensity level;Long Term:   Able to use RPE to guide intensity level when exercising independently       Able to understand and use Dyspnea scale Yes       Intervention Provide education and explanation on how to use Dyspnea scale       Expected Outcomes Short Term: Able to use Dyspnea scale daily in rehab to express subjective sense of shortness of breath during exertion;Long Term: Able to use Dyspnea scale to guide intensity level when exercising independently       Knowledge and understanding of Target Heart Rate Range (THRR) Yes       Intervention Provide education and explanation of THRR including how the numbers were predicted and where they are located for reference       Expected Outcomes Short Term: Able to state/look up THRR;Short Term: Able to use daily as guideline for intensity in rehab;Long Term: Able to use THRR to govern intensity when exercising independently       Understanding of Exercise Prescription Yes       Intervention Provide education, explanation, and written materials on patient's individual exercise prescription       Expected Outcomes Short Term: Able to explain program exercise prescription;Long Term: Able to explain home exercise prescription to exercise independently                Exercise Goals Re-Evaluation :  Exercise Goals Re-Evaluation     Row  Name 08/01/23 1616 08/28/23 0947 09/19/23 1615         Exercise Goal Re-Evaluation   Exercise Goals Review Increase Physical Activity;Able to understand and use Dyspnea scale;Understanding of Exercise Prescription;Increase Strength and Stamina;Knowledge and understanding of Target Heart Rate Range (THRR);Able to understand and use rate of perceived exertion (RPE) scale Increase Physical Activity;Able to understand and use Dyspnea scale;Understanding of Exercise Prescription;Increase Strength and Stamina;Knowledge and understanding of Target Heart Rate Range (THRR);Able to understand and use rate of perceived exertion (RPE) scale Increase  Physical Activity;Able to understand and use Dyspnea scale;Understanding of Exercise Prescription;Increase Strength and Stamina;Knowledge and understanding of Target Heart Rate Range (THRR);Able to understand and use rate of perceived exertion (RPE) scale     Comments Lisa Lawrence has completed 4 exercise sessions. She exercises for 15 min on the rower and treadmill. Lisa Lawrence averages 53 watts at level 3 on the rower and 4.4 METs at 3.3 mph and 2% incline. She performs the warmup and cooldown standing without limitations. Lisa Lawrence has increased her workload for both exercise modes as she tolerates progressions well. Watts and METs have also increased. I am hoping to progressing her to interval running on the treadmill after a couple of week. Will continue to monitor and progress as able. Lisa Lawrence has completed 9 exercise sessions. She exercises for 15 min on the upright bike and treadmill. Areyanna averages 4.9 METs at level 3.5 on the upright bike and 4.8 METs at 3.5 mph and 2% incline on the treadmill. She performs the warmup and coodown standing without limitations. Raaga has transferred from the rower to the upright bike in preparation for interval jogging. She has also progressed to interval jogging. I am unsure how well she tolerates interval jogging due to set up issues with the treadmill. Will guide in set up on the treadmill next sessions. Her upright bike workload had to be decreased from 3.5 to 2 due to her high HR. Will continue to monitor and progress as able. Lisa Lawrence has completed 13 exercise sessions. She exercises for 15 min on the upright bike and treadmill. Lisa Lawrence averages 3.8 METs at level 2 on the upright bike and 3.9-7.6 METs on the treadmill. She performs the warmup and coodown standing without limitations. Lisa Lawrence has been able to do 4 exercise sessions of interval jogging. She tolerates jogging well as heart rate has remained stable. I have decreased her workload on the upright bike to prevent her heart  rate from increasing dramatically. I would like to progress Lisa Lawrence's interval jogging, but she has not been able to consistenly jog for 2 weeks. Will plan to increase jogging interval after next week. Will continue to monitor and progress as able.     Expected Outcomes Through exercise at rehab and home, the patient will decrease shortness of breath with daily activities and feel confident in carrying out an exercise regimen at home Through exercise at rehab and home, the patient will decrease shortness of breath with daily activities and feel confident in carrying out an exercise regimen at home Through exercise at rehab and home, the patient will decrease shortness of breath with daily activities and feel confident in carrying out an exercise regimen at home              Discharge Exercise Prescription (Final Exercise Prescription Changes):  Exercise Prescription Changes - 09/17/23 0900       Response to Exercise   Blood Pressure (Admit) 108/58    Blood Pressure (Exercise) 130/60    Blood  Pressure (Exit) 90/46    Heart Rate (Admit) 76 bpm    Heart Rate (Exercise) 145 bpm    Heart Rate (Exit) 101 bpm    Oxygen Saturation (Admit) 100 %    Oxygen Saturation (Exercise) 98 %    Oxygen Saturation (Exit) 98 %    Rating of Perceived Exertion (Exercise) 12    Perceived Dyspnea (Exercise) 1    Duration Continue with 30 min of aerobic exercise without signs/symptoms of physical distress.    Intensity THRR unchanged      Progression   Progression Continue to progress workloads to maintain intensity without signs/symptoms of physical distress.      Resistance Training   Training Prescription Yes    Weight black bands    Reps 10-15    Time 10 Minutes      Interval Training   Interval Training Yes    Equipment Treadmill    Comments 30 sec @ 5. & 0% incline, 2:30 @ 3.5 mph & 2% incline      Treadmill   MPH 5.2    Grade 0    Minutes 15    METs 7.6      Bike   Level 2    Minutes  15    METs 3.9             Nutrition:  Target Goals: Understanding of nutrition guidelines, daily intake of sodium 1500mg , cholesterol 200mg , calories 30% from fat and 7% or less from saturated fats, daily to have 5 or more servings of fruits and vegetables.  Biometrics:  Pre Biometrics - 07/15/23 1143       Pre Biometrics   Grip Strength 30 kg              Nutrition Therapy Plan and Nutrition Goals:  Nutrition Therapy & Goals - 08/23/23 1553       Nutrition Therapy   Diet General Healthy Diet      Personal Nutrition Goals   Nutrition Goal Patient to monitor sodium and fluid intake regularly.   goals in progress.   Personal Goal #2 Patient to improve diet quality by using the plate method as a guide for meal planning to include lean protein/plant protein, fruits, vegetables, whole grains, nonfat dairy as part of a well-balanced diet.   goal in progress.   Comments Goals in progress. 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 She has been worked up for POTS but did not meet diagnosis criteria. Yesli denies nutrition concerns at this time. She eats three meals daily and does enjoy a wide variety of foods. She reports getting 50-64oz of fluids daily. She does not drink caffeine. She continues PO iron and bi-monthly B12 injections. We have discussed increasing fluid intake to 2-3L daily and moderate salt intake to aid with long covid associated tachycardia. She has maintained her weight since starting with our program. Patient will benefit from participation in pulmonary rehab for nutrition, exercise, and lifestyle modification.      Intervention Plan   Intervention Prescribe, educate and counsel regarding individualized specific dietary modifications aiming towards targeted core components such as weight, hypertension, lipid management, diabetes, heart failure and other comorbidities.;Nutrition handout(s) given to patient.    Expected Outcomes Short Term  Goal: Understand basic principles of dietary content, such as calories, fat, sodium, cholesterol and nutrients.;Long Term Goal: Adherence to prescribed nutrition plan.             Nutrition Assessments:  Nutrition Assessments -  07/25/23 1014       Rate Your Plate Scores   Pre Score 56            MEDIFICTS Score Key: >=70 Need to make dietary changes  40-70 Heart Healthy Diet <= 40 Therapeutic Level Cholesterol Diet  Flowsheet Row PULMONARY REHAB CHRONIC OBSTRUCTIVE PULMONARY DISEASE from 07/25/2023 in [facility], [facility], [facility]  Picture Your Plate Total Score on Admission 56      Picture Your Plate Scores: <32 Unhealthy dietary pattern with much room for improvement. 41-50 Dietary pattern unlikely to meet recommendations for good health and room for improvement. 51-60 More healthful dietary pattern, with some room for improvement.  >60 Healthy dietary pattern, although there may be some specific behaviors that could be improved.    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 She has been worked up for POTS but did not meet diagnosis criteria. Lisa Lawrence denies nutrition concerns at this time. She eats three meals daily and does enjoy a wide variety of foods. She reports getting 50-64oz of fluids daily. She does not drink caffeine. She continues PO iron and bi-monthly B12 injections. Patient will benefit from participation in pulmonary rehab for nutrition, exercise, and lifestyle modification. Goals in progress. 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 She has been worked up for POTS but did not meet diagnosis criteria. Lisa Lawrence  denies nutrition concerns at this time. She eats three meals daily and does enjoy a wide variety of foods. She reports getting 50-64oz of fluids daily. She does not drink caffeine. She continues PO iron and bi-monthly B12 injections. We have discussed increasing fluid intake to 2-3L daily and moderate salt intake to aid with long covid associated tachycardia. She has maintained her weight since starting with our program. Patient will benefit from participation in pulmonary rehab for nutrition, exercise, and lifestyle modification.               Nutrition Goals Discharge (Final Nutrition Goals Re-Evaluation):  Nutrition Goals Re-Evaluation - 08/23/23 1553       Goals   Current Weight 131 lb 6.3 oz (59.6 kg)    Comment no new labs; most recent labs WNL    Expected Outcome Goals in progress. 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 She has been worked up for POTS but did not meet diagnosis criteria. Lisa Lawrence denies nutrition concerns at this time. She eats three meals daily and does enjoy a wide variety of foods. She reports getting 50-64oz of fluids daily. She does not drink caffeine. She continues PO iron and bi-monthly B12 injections. We have discussed increasing fluid intake to 2-3L daily and moderate salt intake to aid with long covid associated tachycardia. She has maintained her weight since starting with our program. Patient will benefit from participation in pulmonary rehab for nutrition, exercise, and lifestyle modification.             Psychosocial: Target Goals: Acknowledge presence or absence of significant depression and/or stress, maximize coping skills, provide positive support system. Participant is able to verbalize types  and ability to use techniques and skills needed for reducing stress and depression.  Initial Review & Psychosocial Screening:  Initial Psych Review & Screening - 07/15/23 1041       Initial Review   Current issues with Current  Anxiety/Panic      Family Dynamics   Good Support System? Yes    Comments Lisa Lawrence states she gets anxious when she experiences SOB      Barriers   Psychosocial barriers to participate in program The patient should benefit from training in stress management and relaxation.      Screening Interventions   Interventions Encouraged to exercise             Quality of Life Scores:  Scores of 19 and below usually indicate a poorer quality of life in these areas.  A difference of  2-3 points is a clinically meaningful difference.  A difference of 2-3 points in the total score of the Quality of Life Index has been associated with significant improvement in overall quality of life, self-image, physical symptoms, and general health in studies assessing change in quality of life.  PHQ-9: Review Flowsheet       07/15/2023  Depression screen PHQ 2/9  Decreased Interest 1  Down, Depressed, Hopeless 0  PHQ - 2 Score 1  Altered sleeping 0  Tired, decreased energy 1  Change in appetite 0  Feeling bad or failure about yourself  0  Trouble concentrating 0  Moving slowly or fidgety/restless 0  Suicidal thoughts 0  PHQ-9 Score 2  Difficult doing work/chores Somewhat difficult   Interpretation of Total Score  Total Score Depression Severity:  1-4 = Minimal depression, 5-9 = Mild depression, 10-14 = Moderate depression, 15-19 = Moderately severe depression, 20-27 = Severe depression   Psychosocial Evaluation and Intervention:  Psychosocial Evaluation - 07/15/23 1042       Psychosocial Evaluation & Interventions   Interventions Encouraged to exercise with the program and follow exercise prescription    Comments Lisa Lawrence states she experiences anxiety when she gets SOB. She denies any other psychosocial barriers or concerns.    Expected Outcomes For Lisa Lawrence to participate in the program free of any psychosocial barriers or concerns.    Continue Psychosocial Services  No Follow up required              Psychosocial Re-Evaluation:  Psychosocial Re-Evaluation     Row Name 07/29/23 1038 08/27/23 1530 09/20/23 1425         Psychosocial Re-Evaluation   Current issues with Current Anxiety/Panic Current Anxiety/Panic Current Anxiety/Panic     Comments Lisa Lawrence has attended 2 sessions so far. She denies any new psychosocial barriers or concerns at this time. Lisa Lawrence states her anxiety is controlled at this time. No other concerns or barriers at this time. Lisa Lawrence continues to deny any psy/soc needs during her monthly psychosocial evaluation but got teary eyed when talking about her health issues. She stated she is tired of not getting a definite answer about what's causing her shortness of breath. Lisa Lawrence has acknowledged getting anxious when she does become breathless but has learned purse lipped breathing to cope.     Expected Outcomes For Lisa Lawrence to partcipate in PR free of any psychosocial barriers or concerns. For Lisa Lawrence to partcipate in PR free of any psychosocial barriers or concerns. For Lisa Lawrence to partcipate in PR free of any psychosocial barriers or concerns, to use positive coping skills to manage her stress     Interventions Encouraged to  attend Pulmonary Rehabilitation for the exercise Encouraged to attend Pulmonary Rehabilitation for the exercise Encouraged to attend Pulmonary Rehabilitation for the exercise     Continue Psychosocial Services  No Follow up required No Follow up required No Follow up required              Psychosocial Discharge (Final Psychosocial Re-Evaluation):  Psychosocial Re-Evaluation - 09/20/23 1425       Psychosocial Re-Evaluation   Current issues with Current Anxiety/Panic    Comments Lisa Lawrence continues to deny any psy/soc needs during her monthly psychosocial evaluation but got teary eyed when talking about her health issues. She stated she is tired of not getting a definite answer about what's causing her shortness of breath. Takishia has acknowledged  getting anxious when she does become breathless but has learned purse lipped breathing to cope.    Expected Outcomes For Lisa Lawrence to partcipate in PR free of any psychosocial barriers or concerns, to use positive coping skills to manage her stress    Interventions Encouraged to attend Pulmonary Rehabilitation for the exercise    Continue Psychosocial Services  No Follow up required             Education: Education Goals: Education classes will be provided on a weekly basis, covering required topics. Participant will state understanding/return demonstration of topics presented.  Learning Barriers/Preferences:  Learning Barriers/Preferences - 07/15/23 1042       Learning Barriers/Preferences   Learning Barriers None    Learning Preferences None             Education Topics: Know Your Numbers Group instruction that is supported by a PowerPoint presentation. Instructor discusses importance of knowing and understanding resting, exercise, and post-exercise oxygen saturation, heart rate, and blood pressure. Oxygen saturation, heart rate, blood pressure, rating of perceived exertion, and dyspnea are reviewed along with a normal range for these values.    Exercise for the Pulmonary Patient Group instruction that is supported by a PowerPoint presentation. Instructor discusses benefits of exercise, core components of exercise, frequency, duration, and intensity of an exercise routine, importance of utilizing pulse oximetry during exercise, safety while exercising, and options of places to exercise outside of rehab.    MET Level  Group instruction provided by PowerPoint, verbal discussion, and written material to support subject matter. Instructor reviews what METs are and how to increase METs.  Flowsheet Row PULMONARY REHAB CHRONIC OBSTRUCTIVE PULMONARY DISEASE from 08/29/2023 in [facility] for Heart, Vascular, & Lung Health  Date 08/29/23  Educator EP   Instruction Review Code 1- Verbalizes Understanding       Pulmonary Medications Verbally interactive group education provided by instructor with focus on inhaled medications and proper administration. Flowsheet Row PULMONARY REHAB CHRONIC OBSTRUCTIVE PULMONARY DISEASE from 09/26/2023 in [facility] for Heart, Vascular, & Lung Health  Date 09/26/23  Educator RT  Instruction Review Code 1- Verbalizes Understanding       Anatomy and Physiology of the Respiratory System Group instruction provided by PowerPoint, verbal discussion, and written material to support subject matter. Instructor reviews respiratory cycle and anatomical components of the respiratory system and their functions. Instructor also reviews differences in obstructive and restrictive respiratory diseases with examples of each.  Flowsheet Row PULMONARY REHAB CHRONIC OBSTRUCTIVE PULMONARY DISEASE from 09/19/2023 in [facility] for Heart, Vascular, & Lung Health  Date 09/19/23  Educator RT  Instruction Review Code 1- Verbalizes Understanding       Oxygen Safety  Group instruction provided by PowerPoint, verbal discussion, and written material to support subject matter. There is an overview of "What is Oxygen" and "Why do we need it".  Instructor also reviews how to create a safe environment for oxygen use, the importance of using oxygen as prescribed, and the risks of noncompliance. There is a brief discussion on traveling with oxygen and resources the patient may utilize.   Oxygen Use Group instruction provided by PowerPoint, verbal discussion, and written material to discuss how supplemental oxygen is prescribed and different types of oxygen supply systems. Resources for more information are provided.  Flowsheet Row PULMONARY REHAB CHRONIC OBSTRUCTIVE PULMONARY DISEASE from 07/25/2023 in [facility] for Heart, Vascular, & Lung Health  Date 07/25/23   Educator RT  Instruction Review Code 1- Verbalizes Understanding       Breathing Techniques Group instruction that is supported by demonstration and informational handouts. Instructor discusses the benefits of pursed lip and diaphragmatic breathing and detailed demonstration on how to perform both.  Flowsheet Row PULMONARY REHAB CHRONIC OBSTRUCTIVE PULMONARY DISEASE from 08/01/2023 in [facility] for Heart, Vascular, & Lung Health  Date 08/01/23  Educator RN  Instruction Review Code 1- Verbalizes Understanding        Risk Factor Reduction Group instruction that is supported by a PowerPoint presentation. Instructor discusses the definition of a risk factor, different risk factors for pulmonary disease, and how the heart and lungs work together. Flowsheet Row PULMONARY REHAB CHRONIC OBSTRUCTIVE PULMONARY DISEASE from 08/22/2023 in [facility] for Heart, Vascular, & Lung Health  Date 08/22/23  Educator EP  Instruction Review Code 1- Verbalizes Understanding       Pulmonary Diseases Group instruction provided by PowerPoint, verbal discussion, and written material to support subject matter. Instructor gives an overview of the different type of pulmonary diseases. There is also a discussion on risk factors and symptoms as well as ways to manage the diseases. Flowsheet Row PULMONARY REHAB CHRONIC OBSTRUCTIVE PULMONARY DISEASE from 09/12/2023 in [facility] for Heart, Vascular, & Lung Health  Date 09/12/23  Educator RT  Instruction Review Code 1- Verbalizes Understanding       Stress and Energy Conservation Group instruction provided by PowerPoint, verbal discussion, and written material to support subject matter. Instructor gives an overview of stress and the impact it can have on the body. Instructor also reviews ways to reduce stress. There is also a discussion on energy conservation and ways to conserve energy  throughout the day. Flowsheet Row PULMONARY REHAB CHRONIC OBSTRUCTIVE PULMONARY DISEASE from 08/08/2023 in [facility] for Heart, Vascular, & Lung Health  Date 08/08/23  Educator RN  Instruction Review Code 1- Verbalizes Understanding       Warning Signs and Symptoms Group instruction provided by PowerPoint, verbal discussion, and written material to support subject matter. Instructor reviews warning signs and symptoms of stroke, heart attack, cold and flu. Instructor also reviews ways to prevent the spread of infection.   Other Education Group or individual verbal, written, or video instructions that support the educational goals of the pulmonary rehab program.    Knowledge Questionnaire Score:  Knowledge Questionnaire Score - 07/15/23 1101       Knowledge Questionnaire Score   Pre Score 14/18             Core Components/Risk Factors/Patient Goals at Admission:  Personal Goals and Risk Factors at Admission - 07/15/23 1042  Core Components/Risk Factors/Patient Goals on Admission   Improve shortness of breath with ADL's Yes    Intervention Provide education, individualized exercise plan and daily activity instruction to help decrease symptoms of SOB with activities of daily living.    Expected Outcomes Short Term: Improve cardiorespiratory fitness to achieve a reduction of symptoms when performing ADLs;Long Term: Be able to perform more ADLs without symptoms or delay the onset of symptoms             Core Components/Risk Factors/Patient Goals Review:   Goals and Risk Factor Review     Row Name 07/29/23 1040 08/27/23 1531 09/20/23 1426         Core Components/Risk Factors/Patient Goals Review   Personal Goals Review Improve shortness of breath with ADL's;Develop more efficient breathing techniques such as purse lipped breathing and diaphragmatic breathing and practicing self-pacing with activity. Improve shortness of breath with ADL's  Improve shortness of breath with ADL's     Review Jarya has attended two sessions so far. Goal progressing on improving her shortness of breath with ADLs. Goal progressing on developing more efficient breathing techniques such as purse lipped breathing and diaphragmatic breathing; and practicing self-pacing with activity. Goal progressing on improving her shortness of breath with ADLs. Goal met on developing more efficient breathing techniques such as purse lipped breathing and diaphragmatic breathing; and practicing self-pacing with activity. Neveen is able to demonstrate purse lip breathing when she gets SOB. Sats have remained 97-100% on RA while exercising. We will continue to monitor Myndi's progress throughout the program. Monthly review of patient's Core Components/Risk Factors/Patient Goals are as follows: Goal progressing on improving shortness of breath with ADLs. Saher can tolerate intervals, walking and running on the treadmill. She has increased her speed and incline, and her oxygen saturation has remained within normal limits. She has also been able to increase her speed and workload on the upright bike, but staff have had to decrease her intensity due to reaching her max target heart rate. Staff have reached out to her cardiologist for possible increase in max THR. If Danaeja is having a good breathing day, she can fully exert herself. If she has been dyspneic before arrival, she knows how to self-pace herself for the day. Keyla will continue to benefit from participation in PR for nutrition, education, exercise, and lifestyle modification.     Expected Outcomes For Chyla to improve her shortness of breath with ADL's and develop more efficient breathing techniques such as purse lipped breathing and diaphragmatic breathing; and practicing self-pacing with activity. For Liddy to improve her shortness of breath with ADL's. For Donnielle to improve her shortness of breath with ADL's.               Core Components/Risk Factors/Patient Goals at Discharge (Final Review):   Goals and Risk Factor Review - 09/20/23 1426       Core Components/Risk Factors/Patient Goals Review   Personal Goals Review Improve shortness of breath with ADL's    Review Monthly review of patient's Core Components/Risk Factors/Patient Goals are as follows: Goal progressing on improving shortness of breath with ADLs. Shakeera can tolerate intervals, walking and running on the treadmill. She has increased her speed and incline, and her oxygen saturation has remained within normal limits. She has also been able to increase her speed and workload on the upright bike, but staff have had to decrease her intensity due to reaching her max target heart rate. Staff have reached out to her cardiologist for  possible increase in max THR. If Ileyah is having a good breathing day, she can fully exert herself. If she has been dyspneic before arrival, she knows how to self-pace herself for the day. Samayah will continue to benefit from participation in PR for nutrition, education, exercise, and lifestyle modification.    Expected Outcomes For Jenalee to improve her shortness of breath with ADL's.             ITP Comments:Pt is making expected progress toward Pulmonary Rehab goals after completing 15 session(s). Recommend continued exercise, life style modification, education, and utilization of breathing techniques to increase stamina and strength, while also decreasing shortness of breath with exertion.  Dr. Mechele Collin is Medical Director for Pulmonary Rehab at [facility].     Comments: Dr. Mechele Collin is Medical Director for Pulmonary Rehab at [facility].

## 2023-10-03 ENCOUNTER — Encounter (HOSPITAL_COMMUNITY)
Admission: RE | Admit: 2023-10-03 | Discharge: 2023-10-03 | Disposition: A | Discharge status: Home / Self Care | Payer: BC Managed Care – PPO | Source: Ambulatory Visit | Attending: Pulmonary Disease | Admitting: Pulmonary Disease

## 2023-10-03 DIAGNOSIS — R0609 Other forms of dyspnea: Secondary | ICD-10-CM | POA: Diagnosis not present

## 2023-10-03 DIAGNOSIS — U099 Post covid-19 condition, unspecified: Secondary | ICD-10-CM

## 2023-10-03 NOTE — Progress Notes (Signed)
Daily Session Note  Patient Details  Name: Lisa Lawrence MRN: [id number] Date of Birth: 2001/09/06 Referring Provider:   Doristine Devoid Pulmonary Rehab Walk Test from 07/15/2023 in [facility] for Heart, Vascular, & Lung Health  Referring Provider Hunsucker       Encounter Date: 10/03/2023  Check In:  Session Check In - 10/03/23 1027       Check-In   Supervising physician immediately available to respond to emergencies [facility] MD immediately available    Physician(s) Reather Littler, NP    Location [facility]    Staff Present Durel Salts, Zella Richer, MS, ACSM-CEP, Exercise Physiologist;Dennison Mcdaid Gerre Scull, RN, BSN    Virtual Visit No    Medication changes reported     No    Fall or balance concerns reported    No    Tobacco Cessation No Change    Warm-up and Cool-down Performed as group-led instruction    Resistance Training Performed Yes    VAD Patient? No    PAD/SET Patient? No      Pain Assessment   Currently in Pain? No/denies    Multiple Pain Sites No             Capillary Blood Glucose: No results found for this or any previous visit (from the past 24 hours).    Social History   Tobacco Use  Smoking Status Never  Smokeless Tobacco Never    Goals Met:  Independence with exercise equipment Exercise tolerated well No report of concerns or symptoms today Strength training completed today  Goals Unmet:  Not Applicable  Comments: Service time is from 1011 to 0925    Dr. Mechele Collin is Medical Director for Pulmonary Rehab at [facility].

## 2023-10-08 ENCOUNTER — Encounter (HOSPITAL_COMMUNITY)
Admission: RE | Admit: 2023-10-08 | Discharge: 2023-10-08 | Disposition: A | Discharge status: Home / Self Care | Payer: BC Managed Care – PPO | Source: Ambulatory Visit | Attending: Pulmonary Disease | Admitting: Pulmonary Disease

## 2023-10-08 DIAGNOSIS — U099 Post covid-19 condition, unspecified: Secondary | ICD-10-CM

## 2023-10-08 DIAGNOSIS — R0609 Other forms of dyspnea: Secondary | ICD-10-CM | POA: Diagnosis not present

## 2023-10-08 NOTE — Progress Notes (Signed)
 Daily Session Note  Patient Details  Name: Lisa Lawrence MRN: [id number] Date of Birth: [birth year] Referring Provider:   Conrad Ports Pulmonary Rehab Walk Test from 07/15/2023 in [facility] for Heart, Vascular, & Lung Health  Referring Provider Hunsucker       Encounter Date: 10/08/2023  Check In:  Session Check In - 10/08/23 0835       Check-In   Supervising physician immediately available to respond to emergencies [facility] MD immediately available    Physician(s) Orren Fabry, NP    Location [facility]    Staff Present Augustin Sharps, Neita Moats, MS, ACSM-CEP, Exercise Physiologist;Justice Aguirre Harvy, RN, BSN    Virtual Visit No    Medication changes reported     No    Fall or balance concerns reported    No    Tobacco Cessation No Change    Warm-up and Cool-down Performed as group-led instruction    Resistance Training Performed Yes    VAD Patient? No    PAD/SET Patient? No      Pain Assessment   Currently in Pain? No/denies    Multiple Pain Sites No             Capillary Blood Glucose: No results found for this or any previous visit (from the past 24 hours).    Social History   Tobacco Use  Smoking Status Never  Smokeless Tobacco Never    Goals Met:  Independence with exercise equipment Exercise tolerated well No report of concerns or symptoms today Strength training completed today  Goals Unmet:  Not Applicable  Comments: Service time is from 0806 to 0920    Dr. Slater Staff is Medical Director for Pulmonary Rehab at [facility].

## 2023-10-10 ENCOUNTER — Encounter (HOSPITAL_COMMUNITY)
Admission: RE | Admit: 2023-10-10 | Discharge: 2023-10-10 | Disposition: A | Discharge status: Home / Self Care | Payer: BC Managed Care – PPO | Source: Ambulatory Visit | Attending: Pulmonary Disease | Admitting: Pulmonary Disease

## 2023-10-10 DIAGNOSIS — U099 Post covid-19 condition, unspecified: Secondary | ICD-10-CM | POA: Insufficient documentation

## 2023-10-10 DIAGNOSIS — R0609 Other forms of dyspnea: Secondary | ICD-10-CM | POA: Diagnosis present

## 2023-10-10 NOTE — Progress Notes (Signed)
 Daily Session Note  Patient Details  Name: Lisa Lawrence MRN: [id number] Date of Birth: 2000/11/13 Referring Provider:   Conrad Ports Pulmonary Rehab Walk Test from 07/15/2023 in [facility], [facility]  Referring Provider Hunsucker       Encounter Date: 10/10/2023  Check In:  Session Check In - 10/10/23 9170       Check-In   Supervising physician immediately available to respond to emergencies [facility] MD immediately available    Physician(s) Rosaline Skains, NP    Location [facility]    Staff Present Augustin Sharps, Neita Moats, MS, ACSM-CEP, Exercise Physiologist;Dmitri Pettigrew Harvy, RN, BSN    Virtual Visit No    Medication changes reported     No    Fall or balance concerns reported    No    Tobacco Cessation No Change    Warm-up and Cool-down Performed as group-led instruction    Resistance Training Performed Yes    VAD Patient? No    PAD/SET Patient? No      Pain Assessment   Currently in Pain? No/denies    Multiple Pain Sites No             Capillary Blood Glucose: No results found for this or any previous visit (from the past 24 hours).    Social History   Tobacco Use  Smoking Status Never  Smokeless Tobacco Never    Goals Met:  Independence with exercise equipment Exercise tolerated well No report of concerns or symptoms today Strength training completed today  Goals Unmet:  Not Applicable  Comments: Service time is from 0807 to 0924    Dr. Slater Staff is Medical Director for Pulmonary Rehab at [facility].

## 2023-10-15 ENCOUNTER — Encounter (HOSPITAL_COMMUNITY)
Admission: RE | Admit: 2023-10-15 | Discharge: 2023-10-15 | Disposition: A | Discharge status: Home / Self Care | Payer: BC Managed Care – PPO | Source: Ambulatory Visit | Attending: Pulmonary Disease | Admitting: Pulmonary Disease

## 2023-10-15 VITALS — Wt 133.8 lb

## 2023-10-15 DIAGNOSIS — R0609 Other forms of dyspnea: Secondary | ICD-10-CM | POA: Diagnosis not present

## 2023-10-15 DIAGNOSIS — U099 Post covid-19 condition, unspecified: Secondary | ICD-10-CM

## 2023-10-15 NOTE — Progress Notes (Signed)
 Daily Session Note  Patient Details  Name: Lisa Lawrence MRN: [id number] Date of Birth: 01-12-2001 Referring Provider:   Conrad Ports Pulmonary Rehab Walk Test from 07/15/2023 in [facility] for [facility], V[facility], & Lung Health  Referring Provider Hunsucker       Encounter Date: 10/15/2023  Check In:  Session Check In - 10/15/23 9176       Check-In   Supervising physician immediately available to respond to emergencies [facility] MD immediately available    Physician(s) Rosaline Skains, NP    Location [facility]    Staff Present Augustin Sharps, Neita Moats, MS, ACSM-CEP, Exercise Physiologist;Renato Spellman Midge HECKLE, ACSM-CEP, Exercise Physiologist    Virtual Visit No    Medication changes reported     No    Fall or balance concerns reported    No    Tobacco Cessation No Change    Warm-up and Cool-down Performed as group-led instruction    Resistance Training Performed Yes    VAD Patient? No    PAD/SET Patient? No      Pain Assessment   Currently in Pain? No/denies    Multiple Pain Sites No             Capillary Blood Glucose: No results found for this or any previous visit (from the past 24 hours).   Exercise Prescription Changes - 10/15/23 0900       Response to Exercise   Blood Pressure (Admit) 96/58    Blood Pressure (Exercise) 112/60    Blood Pressure (Exit) 102/60    Heart Rate (Admit) 87 bpm    Heart Rate (Exercise) 145 bpm    Heart Rate (Exit) 99 bpm    Oxygen Saturation (Admit) 100 %    Oxygen Saturation (Exercise) 100 %    Oxygen Saturation (Exit) 97 %    Rating of Perceived Exertion (Exercise) 12    Perceived Dyspnea (Exercise) 1    Duration Continue with 30 min of aerobic exercise without signs/symptoms of physical distress.    Intensity THRR unchanged      Progression   Progression Continue to progress workloads to maintain intensity without signs/symptoms of physical distress.      Resistance Training    Training Prescription Yes    Weight black bands    Reps 10-15    Time 10 Minutes      Interval Training   Interval Training Yes    Equipment Treadmill    Comments 1 min@ 5.2 &0% incline, 2 min @ 3.6 mph & 2% incline      Treadmill   MPH 5.2    Grade 0    Minutes 15    METs 7.5      Bike   Level 3    Minutes 15    METs 3.8             Social History   Tobacco Use  Smoking Status Never  Smokeless Tobacco Never    Goals Met:  Independence with exercise equipment Exercise tolerated well No report of concerns or symptoms today Strength training completed today  Goals Unmet:  Not Applicable  Comments: Service time is from 0808 to 0920.    Dr. Slater Staff is Medical Director for Pulmonary Rehab at [facility].

## 2023-10-17 ENCOUNTER — Encounter (HOSPITAL_COMMUNITY)
Admission: RE | Admit: 2023-10-17 | Discharge: 2023-10-17 | Disposition: A | Discharge status: Home / Self Care | Payer: BC Managed Care – PPO | Source: Ambulatory Visit | Attending: Pulmonary Disease | Admitting: Pulmonary Disease

## 2023-10-17 DIAGNOSIS — R0609 Other forms of dyspnea: Secondary | ICD-10-CM | POA: Diagnosis not present

## 2023-10-17 DIAGNOSIS — U099 Post covid-19 condition, unspecified: Secondary | ICD-10-CM

## 2023-10-17 NOTE — Progress Notes (Signed)
 Daily Session Note  Patient Details  Name: Lisa Lawrence MRN: [id number] Date of Birth: 03-25-2001 Referring Provider:   Conrad Ports Pulmonary Rehab Walk Test from 07/15/2023 in [facility], [facility], [facility]  Referring Provider Hunsucker       Encounter Date: 10/17/2023  Check In:  Session Check In - 10/17/23 0834       Check-In   Supervising physician immediately available to respond to emergencies [facility] MD immediately available    Physician(s) Barnie Press, NP    Location [facility]    Staff Present Augustin Sharps, Neita Moats, MS, ACSM-CEP, Exercise Physiologist;Randi Midge HECKLE, ACSM-CEP, Exercise Physiologist;Josee Speece Harvy, RN, BSN    Virtual Visit No    Medication changes reported     No    Fall or balance concerns reported    No    Tobacco Cessation No Change    Warm-up and Cool-down Performed as group-led instruction    Resistance Training Performed Yes    VAD Patient? No    PAD/SET Patient? No      Pain Assessment   Currently in Pain? No/denies    Multiple Pain Sites No             Capillary Blood Glucose: No results found for this or any previous visit (from the past 24 hours).    Social History   Tobacco Use  Smoking Status Never  Smokeless Tobacco Never    Goals Met:  Independence with exercise equipment Exercise tolerated well No report of concerns or symptoms today Strength training completed today  Goals Unmet:  Not Applicable  Comments: Service time is from 0819 to 0933    Dr. Slater Staff is Medical Director for Pulmonary Rehab at [facility].

## 2023-10-22 ENCOUNTER — Encounter (HOSPITAL_COMMUNITY)
Admission: RE | Admit: 2023-10-22 | Discharge: 2023-10-22 | Disposition: A | Discharge status: Home / Self Care | Payer: BC Managed Care – PPO | Source: Ambulatory Visit | Attending: Pulmonary Disease | Admitting: Pulmonary Disease

## 2023-10-22 DIAGNOSIS — R0609 Other forms of dyspnea: Secondary | ICD-10-CM

## 2023-10-22 DIAGNOSIS — U099 Post covid-19 condition, unspecified: Secondary | ICD-10-CM

## 2023-10-22 NOTE — Progress Notes (Signed)
 Daily Session Note  Patient Details  Name: Lisa Lawrence MRN: [id number] Date of Birth: 23-Oct-2000 Referring Provider:   Conrad Ports Pulmonary Rehab Walk Test from 07/15/2023 in [facility], [facility]ascular, [facility]  Referring Provider Hunsucker       Encounter Date: 10/22/2023  Check In:  Session Check In - 10/22/23 0840       Check-In   Supervising physician immediately available to respond to emergencies [facility] immediately available    Physician(s) lamarr Satterfield, NP    [facility]    Staff Present Augustin Sharps, Neita Moats, MS, ACSM-CEP, Exercise Physiologist;Hasheem Voland Midge HECKLE, ACSM-CEP, Exercise Physiologist    Virtual Visit No    Medication changes reported     No    Fall or balance concerns reported    No    Tobacco Cessation No Change    Warm-up and Cool-down Performed as group-led instruction    Resistance Training Performed Yes    VAD Patient? No    PAD/SET Patient? No      Pain Assessment   Currently in Pain? No/denies    Multiple Pain Sites No             Capillary Blood Glucose: No results found for this or any previous visit (from the past 24 hours).    Social History   Tobacco Use  Smoking Status Never  Smokeless Tobacco Never    Goals Met:  Independence with exercise equipment Exercise tolerated well No report of concerns or symptoms today Strength training completed today  Goals Unmet:  Not Applicable  Comments: Service time is from 0810 to 0916.    Dr. Slater Staff is Medical Director for Pulmonary Rehab at [facility].

## 2023-10-24 ENCOUNTER — Encounter (HOSPITAL_COMMUNITY)
Admission: RE | Admit: 2023-10-24 | Discharge: 2023-10-24 | Disposition: A | Discharge status: Home / Self Care | Payer: BC Managed Care – PPO | Source: Ambulatory Visit | Attending: Pulmonary Disease | Admitting: Pulmonary Disease

## 2023-10-24 DIAGNOSIS — R0609 Other forms of dyspnea: Secondary | ICD-10-CM | POA: Diagnosis not present

## 2023-10-24 DIAGNOSIS — U099 Post covid-19 condition, unspecified: Secondary | ICD-10-CM

## 2023-10-24 NOTE — Progress Notes (Signed)
Daily Session Note  Patient Details  Name: Kayliani Tober MRN: [id number] Date of Birth: 12/31/2000 Referring Provider:   Doristine Devoid Pulmonary Rehab Walk Test from 07/15/2023 in [facility] for Heart, Vascular, & Lung Health  Referring Provider Hunsucker       Encounter Date: 10/24/2023  Check In:  Session Check In - 10/24/23 0825       Check-In   Supervising physician immediately available to respond to emergencies [facility] immediately available    Physician(s) Bernadene Person, NP    Location [facility]    Staff Present Durel Salts, Zella Richer, MS, ACSM-CEP, Exercise Physiologist;Randi Dionisio Paschal, ACSM-CEP, Exercise Physiologist;Mary Gerre Scull, RN, BSN    Virtual Visit No    Medication changes reported     No    Fall or balance concerns reported    No    Tobacco Cessation No Change    Warm-up and Cool-down Performed as group-led instruction    Resistance Training Performed Yes    VAD Patient? No    PAD/SET Patient? No      Pain Assessment   Currently in Pain? No/denies             Capillary Blood Glucose: No results found for this or any previous visit (from the past 24 hours).    Social History   Tobacco Use  Smoking Status Never  Smokeless Tobacco Never    Goals Met:  Proper associated with RPD/PD & O2 Sat Independence with exercise equipment Exercise tolerated well No report of concerns or symptoms today Strength training completed today  Goals Unmet:  Not Applicable  Comments: Service time is from 0804 to 0925.    Dr. Mechele Collin is Medical Director for Pulmonary Rehab at [facility].

## 2023-10-24 NOTE — Progress Notes (Signed)
Discharge Progress Report  Patient Details  Name: Lisa Lawrence MRN: [id number] Date of Birth: Sep 03, 2001 Referring Provider:   Doristine Devoid Pulmonary Rehab Walk Test from 07/15/2023 in [facility]  Referring Provider Hunsucker        Number of Visits: 22  Reason for Discharge:  Patient has met program and personal goals.  Smoking History:  Social History   Tobacco Use  Smoking Status Never  Smokeless Tobacco Never    Diagnosis:  DOE (dyspnea on exertion)  Post covid-19 condition, unspecified  ADL [facility]:  Pulmonary Assessment Scores     Row Name 07/15/23 1059 10/15/23 0830       ADL [facility]   ADL Phase Entry Exit    SOB Score total 54 32      CAT Score   CAT Score 11 4      mMRC Score   mMRC Score 2 --             Initial Exercise Prescription:  Initial Exercise Prescription - 07/15/23 1100       Date of Initial Exercise RX and Referring Provider   Date 07/15/23    Referring Provider Hunsucker    Expected Discharge Date 10/10/23      Treadmill   MPH 2.7    Grade 0    Minutes 15      Rower   Level 1    Watts 50    Minutes 15      Prescription Details   Frequency (times per week) 2    Duration Progress to 30 minutes of continuous aerobic without signs/symptoms of physical distress      Intensity   THRR 40-80% of Max Heartrate 79-158    Ratings of Perceived Exertion 11-13    Perceived Dyspnea 0-4      Progression   Progression Continue to progress workloads to maintain intensity without signs/symptoms of physical distress.      Resistance Training   Training Prescription Yes    Weight black bands    Reps 10-15             Discharge Exercise Prescription (Final Exercise Prescription Changes):  Exercise Prescription Changes - 10/15/23 0900       Response to Exercise   Blood Pressure (Admit) 96/58    Blood Pressure (Exercise) 112/60    Blood Pressure (Exit) 102/60     Heart Rate (Admit) 87 bpm    Heart Rate (Exercise) 145 bpm    Heart Rate (Exit) 99 bpm    Oxygen Saturation (Admit) 100 %    Oxygen Saturation (Exercise) 100 %    Oxygen Saturation (Exit) 97 %    Rating of Perceived Exertion (Exercise) 12    Perceived Dyspnea (Exercise) 1    Duration Continue with 30 min of aerobic exercise without signs/symptoms of physical distress.    Intensity THRR unchanged      Progression   Progression Continue to progress workloads to maintain intensity without signs/symptoms of physical distress.      Resistance Training   Training Prescription Yes    Weight black bands    Reps 10-15    Time 10 Minutes      Interval Training   Interval Training Yes    Equipment Treadmill    Comments 1 min@ 5.2 &0% incline, 2 min @ 3.6 mph & 2% incline      Treadmill   MPH 5.2    Grade  0    Minutes 15    METs 7.5      Bike   Level 3    Minutes 15    METs 3.8             Functional Capacity:  6 Minute Walk     Row Name 07/15/23 1132 10/24/23 0846       6 Minute Walk   Phase Initial Discharge    Distance 1690 feet 1875 feet    Distance % Change -- 10.95 %    Distance Feet Change -- 185 ft    Walk Time 6 minutes 6 minutes    # of Rest Breaks 0 0    MPH 3.2 3.55    METS 4.22 7.19    RPE 7 12    Perceived Dyspnea  1 1    VO2 Peak 14.78 25.16    Symptoms No No    Resting HR 73 bpm 85 bpm    Resting BP 104/70 100/58    Resting Oxygen Saturation  100 % 100 %    Exercise Oxygen Saturation  during 6 min walk 98 % 92 %    Max Ex. HR 117 bpm 143 bpm    Max Ex. BP 120/68 122/60    2 Minute Post BP 106/60 108/60      Interval HR   1 Minute HR 95 99    2 Minute HR 104 113    3 Minute HR 103 107    4 Minute HR 110 113    5 Minute HR 114 116    6 Minute HR 117 143    2 Minute Post HR 93 116    Interval Heart Rate? Yes Yes  inacurate HR due to Pt speed and finger probe movement      Interval Oxygen   Interval Oxygen? Yes Yes    Baseline  Oxygen Saturation % 100 % 100 %    1 Minute Oxygen Saturation % 100 % 92 %    1 Minute Liters of Oxygen 0 L 0 L    2 Minute Oxygen Saturation % 98 % 100 %    2 Minute Liters of Oxygen 0 L 0 L    3 Minute Oxygen Saturation % 100 % 100 %    3 Minute Liters of Oxygen 0 L 0 L    4 Minute Oxygen Saturation % 98 % 99 %    4 Minute Liters of Oxygen 0 L 0 L    5 Minute Oxygen Saturation % 98 % 100 %    5 Minute Liters of Oxygen 0 L 0 L    6 Minute Oxygen Saturation % 99 % 99 %    6 Minute Liters of Oxygen 0 L 0 L    2 Minute Post Oxygen Saturation % 100 % 100 %    2 Minute Post Liters of Oxygen 0 L 0 L             Psychological, QOL, Others - Outcomes: PHQ 2/9:    10/15/2023    8:29 AM 07/15/2023   11:08 AM  Depression screen PHQ 2/9  Decreased Interest 0 1  Down, Depressed, Hopeless 0 0  PHQ - 2 Score 0 1  Altered sleeping 0 0  Tired, decreased energy 0 1  Change in appetite 0 0  Feeling bad or failure about yourself  0 0  Trouble concentrating 0 0  Moving slowly or fidgety/restless 0 0  Suicidal thoughts  0 0  PHQ-9 Score 0 2  Difficult doing work/chores Not difficult at all Somewhat difficult    Quality of Life:   Personal Goals: Goals established at orientation with interventions provided to work toward goal.  Personal Goals and Risk Factors at Admission - 07/15/23 1042       Core Components/Risk Factors/Patient Goals on Admission   Improve shortness of breath with ADL's Yes    Intervention Provide education, individualized exercise plan and daily activity instruction to help decrease symptoms of SOB with activities of daily living.    Expected Outcomes Short Term: Improve cardiorespiratory fitness to achieve a reduction of symptoms when performing ADLs;Long Term: Be able to perform more ADLs without symptoms or delay the onset of symptoms              Personal Goals Discharge:  Goals and Risk Factor Review     Row Name 07/29/23 1040 08/27/23 1531 09/20/23  1426 10/24/23 1508       Core Components/Risk Factors/Patient Goals Review   Personal Goals Review Improve shortness of breath with ADL's;Develop more efficient breathing techniques such as purse lipped breathing and diaphragmatic breathing and practicing self-pacing with activity. Improve shortness of breath with ADL's Improve shortness of breath with ADL's --    Review Lakeithia has attended two sessions so far. Goal progressing on improving her shortness of breath with ADLs. Goal progressing on developing more efficient breathing techniques such as purse lipped breathing and diaphragmatic breathing; and practicing self-pacing with activity. Goal progressing on improving her shortness of breath with ADLs. Goal met on developing more efficient breathing techniques such as purse lipped breathing and diaphragmatic breathing; and practicing self-pacing with activity. Sunni is able to demonstrate purse lip breathing when she gets SOB. Sats have remained 97-100% on RA while exercising. We will continue to monitor Sharrell's progress throughout the program. Monthly review of patient's Core Components/Risk Factors/Patient Goals are as follows: Goal progressing on improving shortness of breath with ADLs. Asal can tolerate intervals, walking and running on the treadmill. She has increased her speed and incline, and her oxygen saturation has remained within normal limits. She has also been able to increase her speed and workload on the upright bike, but staff have had to decrease her intensity due to reaching her max target heart rate. Staff have reached out to her cardiologist for possible increase in max THR. If Blondena is having a good breathing day, she can fully exert herself. If she has been dyspneic before arrival, she knows how to self-pace herself for the day. Janki will continue to benefit from participation in PR for nutrition, education, exercise, and lifestyle modification. Cing graduated on 10/24/23 from  the PR program. Goal met for improving shortness of breath with ADL's. Jazzelle's SOB scale decreased from 54 to 32. Chaia met all of her core component goals during the program. Wyolene was a pleasure to work with and we wish her the best.    Expected Outcomes For Emanuelle to improve her shortness of breath with ADL's and develop more efficient breathing techniques such as purse lipped breathing and diaphragmatic breathing; and practicing self-pacing with activity. For Virginia to improve her shortness of breath with ADL's. For Leeba to improve her shortness of breath with ADL's. --             Exercise Goals and Review:  Exercise Goals     Row Name 07/15/23 1044             Exercise  Goals   Increase Physical Activity Yes       Intervention Provide advice, education, support and counseling about physical activity/exercise needs.;Develop an individualized exercise prescription for aerobic and resistive training based on initial evaluation findings, risk stratification, comorbidities and participant's personal goals.       Expected Outcomes Short Term: Attend rehab on a regular basis to increase amount of physical activity.;Long Term: Add in home exercise to make exercise part of routine and to increase amount of physical activity.;Long Term: Exercising regularly at least 3-5 days a week.       Increase Strength and Stamina Yes       Intervention Provide advice, education, support and counseling about physical activity/exercise needs.;Develop an individualized exercise prescription for aerobic and resistive training based on initial evaluation findings, risk stratification, comorbidities and participant's personal goals.       Expected Outcomes Short Term: Increase workloads from initial exercise prescription for resistance, speed, and METs.;Short Term: Perform resistance training exercises routinely during rehab and add in resistance training at home;Long Term: Improve cardiorespiratory fitness,  muscular endurance and strength as measured by increased METs and functional capacity ( )       Able to understand and use rate of perceived exertion (RPE) scale Yes       Intervention Provide education and explanation on how to use RPE scale       Expected Outcomes Short Term: Able to use RPE daily in rehab to express subjective intensity level;Long Term:  Able to use RPE to guide intensity level when exercising independently       Able to understand and use Dyspnea scale Yes       Intervention Provide education and explanation on how to use Dyspnea scale       Expected Outcomes Short Term: Able to use Dyspnea scale daily in rehab to express subjective sense of shortness of breath during exertion;Long Term: Able to use Dyspnea scale to guide intensity level when exercising independently       Knowledge and understanding of Target Heart Rate Range (THRR) Yes       Intervention Provide education and explanation of THRR including how the numbers were predicted and where they are located for reference       Expected Outcomes Short Term: Able to state/look up THRR;Short Term: Able to use daily as guideline for intensity in rehab;Long Term: Able to use THRR to govern intensity when exercising independently       Understanding of Exercise Prescription Yes       Intervention Provide education, explanation, and written materials on patient's individual exercise prescription       Expected Outcomes Short Term: Able to explain program exercise prescription;Long Term: Able to explain home exercise prescription to exercise independently                Exercise Goals Re-Evaluation:  Exercise Goals Re-Evaluation     Row Name 08/01/23 1616 08/28/23 0947 09/19/23 1615 10/24/23 1456       Exercise Goal Re-Evaluation   Exercise Goals Review Increase Physical Activity;Able to understand and use Dyspnea scale;Understanding of Exercise Prescription;Increase Strength and Stamina;Knowledge and understanding  of Target Heart Rate Range (THRR);Able to understand and use rate of perceived exertion (RPE) scale Increase Physical Activity;Able to understand and use Dyspnea scale;Understanding of Exercise Prescription;Increase Strength and Stamina;Knowledge and understanding of Target Heart Rate Range (THRR);Able to understand and use rate of perceived exertion (RPE) scale Increase Physical Activity;Able to understand and use Dyspnea scale;Understanding of  Exercise Prescription;Increase Strength and Stamina;Knowledge and understanding of Target Heart Rate Range (THRR);Able to understand and use rate of perceived exertion (RPE) scale Increase Physical Activity;Able to understand and use Dyspnea scale;Understanding of Exercise Prescription;Increase Strength and Stamina;Knowledge and understanding of Target Heart Rate Range (THRR);Able to understand and use rate of perceived exertion (RPE) scale    Comments Nation has completed 4 exercise sessions. She exercises for 15 min on the rower and treadmill. Delany averages 53 watts at level 3 on the rower and 4.4 METs at 3.3 mph and 2% incline. She performs the warmup and cooldown standing without limitations. Toshua has increased her workload for both exercise modes as she tolerates progressions well. Watts and METs have also increased. I am hoping to progressing her to interval running on the treadmill after a couple of week. Will continue to monitor and progress as able. Dilcia has completed 9 exercise sessions. She exercises for 15 min on the upright bike and treadmill. Sharnay averages 4.9 METs at level 3.5 on the upright bike and 4.8 METs at 3.5 mph and 2% incline on the treadmill. She performs the warmup and coodown standing without limitations. Dorine has transferred from the rower to the upright bike in preparation for interval jogging. She has also progressed to interval jogging. I am unsure how well she tolerates interval jogging due to set up issues with the treadmill. Will  guide in set up on the treadmill next sessions. Her upright bike workload had to be decreased from 3.5 to 2 due to her high HR. Will continue to monitor and progress as able. Eleta has completed 13 exercise sessions. She exercises for 15 min on the upright bike and treadmill. Merleen averages 3.8 METs at level 2 on the upright bike and 3.9-7.6 METs on the treadmill. She performs the warmup and coodown standing without limitations. Calliana has been able to do 4 exercise sessions of interval jogging. She tolerates jogging well as heart rate has remained stable. I have decreased her workload on the upright bike to prevent her heart rate from increasing dramatically. I would like to progress Romonia's interval jogging, but she has not been able to consistenly jog for 2 weeks. Will plan to increase jogging interval after next week. Will continue to monitor and progress as able. Thomas has completed 22 exercise sessions. Her peak METs 7.6 on the treadmill and 3.9 METs on the upright bike. She feels less short of breath since starting rehab. I feel confident in Keshonda carrying out an exercise regimen at home.    Expected Outcomes Through exercise at rehab and home, the patient will decrease shortness of breath with daily activities and feel confident in carrying out an exercise regimen at home Through exercise at rehab and home, the patient will decrease shortness of breath with daily activities and feel confident in carrying out an exercise regimen at home Through exercise at rehab and home, the patient will decrease shortness of breath with daily activities and feel confident in carrying out an exercise regimen at home Through exercise at rehab and home, the patient will decrease shortness of breath with daily activities and feel confident in carrying out an exercise regimen at home             Nutrition & Weight - Outcomes:  Pre Biometrics - 07/15/23 1143       Pre Biometrics   Grip Strength 30 kg               Nutrition:  Nutrition  Therapy & Goals - 08/23/23 1553       Nutrition Therapy   Diet General Healthy Diet      Personal Nutrition Goals   Nutrition Goal Patient to monitor sodium and fluid intake regularly.   goals in progress.   Personal Goal #2 Patient to improve diet quality by using the plate method as a guide for meal planning to include lean protein/plant protein, fruits, vegetables, whole grains, nonfat dairy as part of a well-balanced diet.   goal in progress.   Comments Goals in progress. 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 She has been worked up for POTS but did not meet diagnosis criteria. Marleny denies nutrition concerns at this time. She eats three meals daily and does enjoy a wide variety of foods. She reports getting 50-64oz of fluids daily. She does not drink caffeine. She continues PO iron and bi-monthly B12 injections. We have discussed increasing fluid intake to 2-3L daily and moderate salt intake to aid with long covid associated tachycardia. She has maintained her weight since starting with our program. Patient will benefit from participation in pulmonary rehab for nutrition, exercise, and lifestyle modification.      Intervention Plan   Intervention Prescribe, educate and counsel regarding individualized specific dietary modifications aiming towards targeted core components such as weight, hypertension, lipid management, diabetes, heart failure and other comorbidities.;Nutrition handout(s) given to patient.    Expected Outcomes Short Term Goal: Understand basic principles of dietary content, such as calories, fat, sodium, cholesterol and nutrients.;Long Term Goal: Adherence to prescribed nutrition plan.             Nutrition Discharge:  Nutrition Assessments - 10/15/23 0830       Rate Your Plate Scores   Post Score 60             Education Questionnaire Score:  Knowledge Questionnaire Score - 10/15/23 0830        Knowledge Questionnaire Score   Post Score 17/18             Goals reviewed with patient; copy given to patient.

## 2023-10-29 ENCOUNTER — Encounter (HOSPITAL_COMMUNITY): Admission: RE | Admit: 2023-10-29 | Payer: BC Managed Care – PPO | Source: Ambulatory Visit

## 2024-01-30 ENCOUNTER — Other Ambulatory Visit: Payer: Self-pay | Admitting: Pulmonary Disease

## 2024-01-30 DIAGNOSIS — J449 Chronic obstructive pulmonary disease, unspecified: Secondary | ICD-10-CM

## 2024-03-10 ENCOUNTER — Telehealth: Payer: Self-pay

## 2024-03-10 DIAGNOSIS — J449 Chronic obstructive pulmonary disease, unspecified: Secondary | ICD-10-CM

## 2024-03-10 MED ORDER — BREZTRI AEROSPHERE 160-9-4.8 MCG/ACT IN AERO: 2.0000 | INHALATION_SPRAY | Freq: Two times a day (BID) | RESPIRATORY_TRACT | 5 refills | Status: DC

## 2024-03-10 NOTE — Telephone Encounter (Signed)
 Copied from CRM [id number]. Topic: Clinical - Prescription Issue >> Mar 09, 2024  3:58 PM Hilton Lucky wrote: Reason for CRM: Patient would like Rx for Breztri  to be renewed to [facility], as they say they are still waiting on a new prescription.  Sending Mychart message. Pt needs to be scheduled ov for further refills. Once appt is scheduled I can send refills to last her till office visit. Please route back to me once pt gets scheduled and I will send Breztri  refill.

## 2024-03-10 NOTE — Telephone Encounter (Signed)
 Sending breztri  rx refill to last till appt 8/21.

## 2024-05-28 ENCOUNTER — Encounter: Payer: Self-pay | Admitting: Pulmonary Disease

## 2024-05-28 ENCOUNTER — Ambulatory Visit (INDEPENDENT_AMBULATORY_CARE_PROVIDER_SITE_OTHER): Admitting: Pulmonary Disease

## 2024-05-28 VITALS — BP 118/80 | HR 83 | Temp 98.2°F | Ht 67.0 in | Wt 133.8 lb

## 2024-05-28 DIAGNOSIS — J4520 Mild intermittent asthma, uncomplicated: Secondary | ICD-10-CM | POA: Diagnosis not present

## 2024-05-28 MED ORDER — BUDESONIDE-FORMOTEROL FUMARATE 160-4.5 MCG/ACT IN AERO: 2.0000 | INHALATION_SPRAY | Freq: Two times a day (BID) | RESPIRATORY_TRACT | 2 refills | Status: DC

## 2024-05-28 NOTE — Progress Notes (Signed)
 @Patient  ID: Lisa Lawrence, female    DOB: 2001-05-19, 23 y.o.   MRN: [id number]  Chief Complaint  Patient presents with   Follow-up    Pt is doing Breztri  everyday but would like to talk about weaning off of the breztri  inhaler. Is rarely having to use rescue inhaler, only on exertion or hot days.     Referring provider: Benson Eleanor PARAS*  HPI:   23 y.o. woman whom are seen in follow up evaluation of dyspnea on exertion.  Most recent Cardiology note from [facility] reviewed.  On Breztri .  Doing well.  Rare albuterol  use sometimes with exertion or prior to exercise etc.  But doing quite well.  She is interested in stepdown therapy we discussed in detail.   HPI at initial visit: Patient contracted COVID about 2 years ago.  Prior to that no issues.  Avid an active exerciser.  Very athletic playing sports in high school.  Since then has had marked dyspnea.  Associate with chest tightness.  Present on flat surfaces with short distances.  Worse on inclines or stairs.  No time of day that seems to make things better or worse.  No position makes it better or worse.  She does note triggers such as candle smoke, fire smoke, other scents that sometimes trigger and make the symptoms come on.  She has been using Flovent 1 puff twice a day with mild improvement in symptoms.  Uses albuterol  intermittently sometimes helps.  Sometimes does not.  Does not help immediately.  She also reports tachycardia.  Heart rates up to 160s just walking around.  Out her portion to level of exertion.  Being seen by cardiology for this.  Recently prescribed beta-blocker.  Chest imaging includes CT scan 10/2021 personally reviewed and interpreted a small amount of pneumomediastinum, otherwise clear lungs no evidence of emphysema or abnormality in the parenchyma.  Repeat CT scan 02/2022 report in care everywhere of clear lungs and resolution of pneumomediastinum.  She had PFTs 11/2021, reviewed without evidence  obstruction, DLCO within normal limits.  Cannot see lung volumes.  Labs in the past without elevation in eosinophils.  PMH: No significant illness Surgical history: Wisdom tooth extraction Family history: Mother with hyperlipidemia, thyroid  disease, father with hyperlipidemia, hypertension Social history: Never smoker, works at [employer] / Pulmonary Flowsheets:   ACT:      No data to display          MMRC:     No data to display          Epworth:      No data to display          Tests:   FENO:  No results found for: NITRICOXIDE  PFT:     No data to display          WALK:     10/24/2023    8:46 AM 07/15/2023   11:32 AM  SIX MIN WALK  2 Minute Oxygen Saturation % 100 % 98 %  2 Minute HR 113 104  4 Minute Oxygen Saturation % 99 % 98 %  4 Minute HR 113 110  6 Minute Oxygen Saturation % 99 % 99 %  6 Minute HR 143 117    Imaging: Personally reviewed and as per EMR and discussion in this note No results found.  Lab Results: Personally reviewed, no elevation of eosinophils CBC    Component Value Date/Time   WBC 7.4 02/01/2023 1245   RBC  4.79 02/01/2023 1245   HGB 12.9 02/01/2023 1511   HCT 38.0 02/01/2023 1511   PLT 205 02/01/2023 1245   MCV 86.6 02/01/2023 1245   MCH 30.1 02/01/2023 1245   MCHC 34.7 02/01/2023 1245   RDW 11.7 02/01/2023 1245    BMET    Component Value Date/Time   NA 140 02/01/2023 1511   K 3.5 02/01/2023 1511   CL 106 02/01/2023 1245   CO2 22 02/01/2023 1245   GLUCOSE 102 (H) 02/01/2023 1245   BUN 7 02/01/2023 1245   CREATININE 0.82 02/01/2023 1245   CALCIUM 9.5 02/01/2023 1245   GFRNONAA >60 02/01/2023 1245    BNP No results found for: BNP  ProBNP No results found for: PROBNP  Specialty Problems       Pulmonary Problems   Asthma   Dyspnea    Allergies  Allergen Reactions   Amoxicillin Hives   Penicillins Hives    Immunization History  Administered Date(s) Administered    DTaP 05/21/2000, 07/22/2000, 09/23/2000, 11/17/2001, 03/27/2004   Dtap, Unspecified 05/21/2000, 07/22/2000, 09/23/2000, 11/17/2001, 03/27/2004   HIB (PRP-T) 05/21/2000, 07/22/2000, 09/23/2000, 06/30/2001   HIB, Unspecified 05/21/2000, 07/22/2000, 09/23/2000, 06/30/2001   HPV Quadrivalent 06/08/2011, 08/10/2011, 02/11/2012   Hep B, Unspecified 2000-07-11, 04/16/2000, 09/23/2000   Hepatitis B, PED/ADOLESCENT 2001-08-29, 04/16/2000, 09/23/2000   Hpv-Unspecified 06/08/2011, 08/10/2011, 02/11/2012   IPV 05/21/2000, 07/22/2000, 11/17/2001, 03/27/2004   Influenza,inj,Quad PF,6+ Mos 08/30/2017, 09/02/2018   MMR 03/26/2001, 03/27/2004   Meningococcal Acwy, Unspecified 06/16/2012   Meningococcal Conjugate 06/16/2012, 08/30/2017   Meningococcal Mcv4o 08/30/2017   Pneumococcal Conjugate-13 05/21/2000, 07/22/2000, 09/23/2000, 11/17/2001   Pneumococcal-Unspecified 05/21/2000, 07/22/2000, 09/23/2000, 11/17/2001   Tdap 05/29/2010   Varicella 03/26/2001, 03/15/2008    History reviewed. No pertinent past medical history.  Tobacco History: Social History   Tobacco Use  Smoking Status Never  Smokeless Tobacco Never   Counseling given: Not Answered   Continue to not smoke  Outpatient Encounter Medications as of 05/28/2024  Medication Sig   albuterol  (VENTOLIN  HFA) 108 (90 Base) MCG/ACT inhaler Inhale 1-2 puffs into the lungs every 6 (six) hours as needed for wheezing or shortness of breath.   Ascorbic Acid (VITAMIN C PO) Take 1 tablet by mouth daily. Elderberry   budesonide -formoterol  (SYMBICORT ) 160-4.5 MCG/ACT inhaler Inhale 2 puffs into the lungs 2 (two) times daily.   Cyanocobalamin  (VITAMIN B-12 IJ) Inject as directed every 14 (fourteen) days.   ferrous sulfate 325 (65 FE) MG tablet Take 325 mg by mouth daily with breakfast.   MAGNESIUM PO Take 1 tablet by mouth daily.   metoprolol  tartrate (LOPRESSOR ) 25 MG tablet Take 12.5 mg by mouth 2 (two) times daily.   Syringe/Needle, Disp,  (SYRINGE 3CC/25GX1) 25G X 1 3 ML MISC Use once a month with b12   TURMERIC PO Take 1 tablet by mouth daily.   [DISCONTINUED] budesonide -glycopyrrolate-formoterol  (BREZTRI  AEROSPHERE) 160-9-4.8 MCG/ACT AERO inhaler Inhale 2 puffs into the lungs in the morning and at bedtime.   [DISCONTINUED] nitrofurantoin, macrocrystal-monohydrate, (MACROBID) 100 MG capsule Take 100 mg by mouth 2 (two) times daily. (Patient not taking: Reported on 05/28/2024)   No facility-administered encounter medications on file as of 05/28/2024.     Review of Systems  Review of Systems  N/a Physical Exam  BP 118/80   Pulse 83   Temp 98.2 F (36.8 C)   Ht 5' 7 (1.702 m)   Wt 133 lb 12.8 oz (60.7 kg)   SpO2 100% Comment: RA  BMI 20.96 kg/m   Wt Readings  from Last 5 Encounters:  05/28/24 133 lb 12.8 oz (60.7 kg)  10/15/23 133 lb 13.1 oz (60.7 kg)  09/26/23 130 lb 15.3 oz (59.4 kg)  09/17/23 130 lb 8.2 oz (59.2 kg)  08/29/23 132 lb 11.5 oz (60.2 kg)    BMI Readings from Last 5 Encounters:  05/28/24 20.96 kg/m  10/15/23 20.96 kg/m  09/26/23 20.51 kg/m  09/17/23 20.44 kg/m  08/29/23 20.79 kg/m     Physical Exam General: Well-appearing, no acute distress Eyes: EOMI, icterus Neck: Supple, no JVP Pulmonary: Clear, normal work of breathing Cardiovascular: Regular rate and rhythm, no murmur Abdomen: Nondistended, sounds present MSK: Vascular joint fusion Neuro: Normal gait, no weakness Psych: Normal mood, full affect   Assessment & Plan:   Dyspnea on exertion, chest tightness: Following COVID infection.  High suspicion for development of asthma.  Chest imaging clear.  PFTs 2/23 via [facility] with normal FEV1: FVC ratio, DLCO 100% predicted, but with significant bronchodilator response.  Her symptoms certainly seem out of proportion to this alone.  Do suspect some underlying tachycardic or inappropriate high heart rate limiting her.  Cardiopulmonary exercise test with largely  normal possible increased.  Right heart catheterization/2024 reassuring.  Overall improved over time with treatment of possible POTS as well as asthma.  Asthma: Clear triggers.  Mild atopic symptoms.  Worsened after COVID infection.  Mild improvement with inhalers although not much.  Bronchodilator spots on PFTs.  Trelegy caused throat hoarseness, soreness.  Breztri  was prescribed with resolution of the side effects.  Significant proved with Breztri .  Now doing well.  Rare albuterol  use.  Stepdown to high-dose Symbicort  2 puff twice daily.  Consider further stepdown to once a day or decrease, low-dose Symbicort  in the future.  Return if symptoms worsen or fail to improve.   Donnice JONELLE Beals, MD 05/28/2024

## 2024-06-01 ENCOUNTER — Encounter: Payer: Self-pay | Admitting: Pulmonary Disease

## 2024-06-01 MED ORDER — PREDNISONE 10 MG PO TABS: 10.0000 mg | ORAL_TABLET | Freq: Every day | ORAL | 0 refills | Status: AC

## 2024-06-01 NOTE — Telephone Encounter (Signed)
**Note De-identified  [place] Obfuscation** Please advise 

## 2024-07-01 ENCOUNTER — Other Ambulatory Visit: Payer: Self-pay | Admitting: Pulmonary Disease

## 2024-08-08 ENCOUNTER — Encounter: Payer: Self-pay | Admitting: Pulmonary Disease

## 2024-09-01 ENCOUNTER — Other Ambulatory Visit: Payer: Self-pay

## 2024-09-01 MED ORDER — BUDESONIDE-FORMOTEROL FUMARATE 160-4.5 MCG/ACT IN AERO: 2.0000 | INHALATION_SPRAY | Freq: Two times a day (BID) | RESPIRATORY_TRACT | 2 refills | Status: AC
